# Patient Record
Sex: Female | Born: 1995 | Race: Black or African American | Hispanic: No | Marital: Single | Smoking: Former smoker
Health system: Southern US, Community
[De-identification: ages and names within clinical notes are randomized; demographics above are authoritative.]

## PROBLEM LIST (undated history)

## (undated) DIAGNOSIS — R011 Cardiac murmur, unspecified: Secondary | ICD-10-CM

## (undated) DIAGNOSIS — I10 Essential (primary) hypertension: Secondary | ICD-10-CM

## (undated) DIAGNOSIS — R569 Unspecified convulsions: Secondary | ICD-10-CM

## (undated) DIAGNOSIS — J45909 Unspecified asthma, uncomplicated: Secondary | ICD-10-CM

## (undated) DIAGNOSIS — F410 Panic disorder [episodic paroxysmal anxiety] without agoraphobia: Secondary | ICD-10-CM

---
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
## 2014-11-06 ENCOUNTER — Telehealth: Payer: Self-pay | Admitting: *Deleted

## 2014-11-06 NOTE — Telephone Encounter (Signed)
I HAVE MADE SEVERAL ATTEMPTS TO REACH THE PT TO GET INFORMATION ON HER PCP, SO THAT I CAN GET MEDICAL RECORDS. WHEN I CALL THE PHONE JUST RINGS NO VM.

## 2014-11-12 ENCOUNTER — Ambulatory Visit: Payer: Medicaid Other | Admitting: Cardiovascular Disease

## 2014-11-20 ENCOUNTER — Encounter: Payer: Self-pay | Admitting: Cardiovascular Disease

## 2015-01-31 ENCOUNTER — Encounter (HOSPITAL_COMMUNITY): Payer: Self-pay

## 2015-01-31 ENCOUNTER — Emergency Department (HOSPITAL_COMMUNITY)
Admission: EM | Admit: 2015-01-31 | Discharge: 2015-01-31 | Disposition: A | Discharge status: Home / Self Care | Payer: Medicaid Other | Attending: Emergency Medicine | Admitting: Emergency Medicine

## 2015-01-31 DIAGNOSIS — F419 Anxiety disorder, unspecified: Secondary | ICD-10-CM | POA: Insufficient documentation

## 2015-01-31 DIAGNOSIS — F410 Panic disorder [episodic paroxysmal anxiety] without agoraphobia: Secondary | ICD-10-CM

## 2015-01-31 DIAGNOSIS — Z720 Tobacco use: Secondary | ICD-10-CM | POA: Insufficient documentation

## 2015-01-31 MED ORDER — IBUPROFEN 200 MG PO TABS
600.0000 mg | ORAL_TABLET | Freq: Once | ORAL | Status: AC
  Administered 2015-01-31: 600 mg via ORAL
  Filled 2015-01-31: qty 3

## 2015-01-31 MED ORDER — ACETAMINOPHEN 325 MG PO TABS
975.0000 mg | ORAL_TABLET | Freq: Once | ORAL | Status: AC
  Administered 2015-01-31: 975 mg via ORAL
  Filled 2015-01-31: qty 3

## 2015-01-31 NOTE — Discharge Instructions (Signed)
Return here as needed.  Follow up with a primary care doctor °
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
## 2015-02-03 ENCOUNTER — Emergency Department (HOSPITAL_COMMUNITY)
Admission: EM | Admit: 2015-02-03 | Discharge: 2015-02-03 | Disposition: A | Discharge status: Home / Self Care | Payer: Medicaid Other | Attending: Emergency Medicine | Admitting: Emergency Medicine

## 2015-02-03 DIAGNOSIS — S51851A Open bite of right forearm, initial encounter: Secondary | ICD-10-CM | POA: Diagnosis not present

## 2015-02-03 DIAGNOSIS — Z720 Tobacco use: Secondary | ICD-10-CM | POA: Insufficient documentation

## 2015-02-03 DIAGNOSIS — Y998 Other external cause status: Secondary | ICD-10-CM | POA: Insufficient documentation

## 2015-02-03 DIAGNOSIS — Y9389 Activity, other specified: Secondary | ICD-10-CM | POA: Insufficient documentation

## 2015-02-03 DIAGNOSIS — Y9289 Other specified places as the place of occurrence of the external cause: Secondary | ICD-10-CM | POA: Diagnosis not present

## 2015-02-03 DIAGNOSIS — W503XXA Accidental bite by another person, initial encounter: Secondary | ICD-10-CM

## 2015-02-03 MED ORDER — AMOXICILLIN-POT CLAVULANATE 875-125 MG PO TABS: 1.0000 | ORAL_TABLET | Freq: Two times a day (BID) | ORAL | Status: DC

## 2015-02-03 MED ORDER — NAPROXEN 500 MG PO TABS: 500.0000 mg | ORAL_TABLET | Freq: Two times a day (BID) | ORAL | Status: DC
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
## 2015-02-16 ENCOUNTER — Encounter: Payer: Self-pay | Admitting: Cardiovascular Disease

## 2015-02-16 ENCOUNTER — Ambulatory Visit: Payer: Medicaid Other | Admitting: Cardiovascular Disease

## 2015-02-17 ENCOUNTER — Encounter (HOSPITAL_COMMUNITY): Payer: Self-pay | Admitting: *Deleted

## 2015-02-17 ENCOUNTER — Encounter: Payer: Self-pay | Admitting: Cardiovascular Disease

## 2015-02-17 ENCOUNTER — Emergency Department (HOSPITAL_COMMUNITY)
Admission: EM | Admit: 2015-02-17 | Discharge: 2015-02-17 | Disposition: A | Discharge status: Home / Self Care | Payer: Medicaid Other | Attending: Emergency Medicine | Admitting: Emergency Medicine

## 2015-02-17 DIAGNOSIS — Z720 Tobacco use: Secondary | ICD-10-CM | POA: Diagnosis not present

## 2015-02-17 DIAGNOSIS — Z791 Long term (current) use of non-steroidal anti-inflammatories (NSAID): Secondary | ICD-10-CM | POA: Insufficient documentation

## 2015-02-17 DIAGNOSIS — Z792 Long term (current) use of antibiotics: Secondary | ICD-10-CM | POA: Insufficient documentation

## 2015-02-17 DIAGNOSIS — F419 Anxiety disorder, unspecified: Secondary | ICD-10-CM | POA: Insufficient documentation
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
## 2015-02-17 NOTE — Discharge Instructions (Signed)
Panic Attacks Follow up with [facility] for anxiety.  Panic attacks are sudden, short feelings of great fear or discomfort. You may have them for no reason when you are relaxed, when you are uneasy (anxious), or when you are sleeping.  HOME CARE  Take all your medicines as told.  Check with your doctor before starting new medicines.  Keep all doctor visits. GET HELP IF:  You are not able to take your medicines as told.  Your symptoms do not get better.  Your symptoms get worse. GET HELP RIGHT AWAY IF:  Your attacks seem different than your normal attacks.  You have thoughts about hurting yourself or others.  You take panic attack medicine and you have a side effect. MAKE SURE YOU:  Understand these instructions.  Will watch your condition.  Will get help right away if you are not doing well or get worse.   This information is not intended to replace advice given to you by your health care provider. Make sure you discuss any questions you have with your health care provider.   Document Released: 05/07/2010 Document Revised: 01/23/2013 Document Reviewed: 11/16/2012 Elsevier Interactive Patient Education [employer]2016 Elsevier Inc.

## 2015-02-17 NOTE — Progress Notes (Signed)
Patient listed as having Medicaid insurance without a pcp.  [facility]'s [facility] card [facility]  System updated.
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
## 2015-05-27 ENCOUNTER — Encounter (HOSPITAL_COMMUNITY): Payer: Self-pay | Admitting: *Deleted

## 2015-05-27 ENCOUNTER — Emergency Department (HOSPITAL_COMMUNITY)
Admission: EM | Admit: 2015-05-27 | Discharge: 2015-05-27 | Disposition: A | Discharge status: Home / Self Care | Payer: Medicaid Other | Attending: Emergency Medicine | Admitting: Emergency Medicine

## 2015-05-27 DIAGNOSIS — R040 Epistaxis: Secondary | ICD-10-CM | POA: Insufficient documentation

## 2015-05-27 DIAGNOSIS — G479 Sleep disorder, unspecified: Secondary | ICD-10-CM | POA: Diagnosis not present

## 2015-05-27 DIAGNOSIS — F17200 Nicotine dependence, unspecified, uncomplicated: Secondary | ICD-10-CM | POA: Insufficient documentation

## 2015-05-27 DIAGNOSIS — Z792 Long term (current) use of antibiotics: Secondary | ICD-10-CM | POA: Insufficient documentation

## 2015-05-27 DIAGNOSIS — H6123 Impacted cerumen, bilateral: Secondary | ICD-10-CM | POA: Insufficient documentation

## 2015-05-27 DIAGNOSIS — R1110 Vomiting, unspecified: Secondary | ICD-10-CM | POA: Diagnosis not present

## 2015-05-27 DIAGNOSIS — R05 Cough: Secondary | ICD-10-CM | POA: Diagnosis present

## 2015-05-27 DIAGNOSIS — Z791 Long term (current) use of non-steroidal anti-inflammatories (NSAID): Secondary | ICD-10-CM | POA: Diagnosis not present

## 2015-05-27 DIAGNOSIS — J069 Acute upper respiratory infection, unspecified: Secondary | ICD-10-CM | POA: Diagnosis not present

## 2015-05-27 MED ORDER — PROMETHAZINE-DM 6.25-15 MG/5ML PO SYRP: 5.0000 mL | ORAL_SOLUTION | Freq: Four times a day (QID) | ORAL | Status: DC | PRN

## 2015-05-27 MED ORDER — GUAIFENESIN 100 MG/5ML PO LIQD: 100.0000 mg | ORAL | Status: DC | PRN

## 2015-05-27 NOTE — ED Notes (Signed)
Pt has multiple complaints. Reports cough, cold symptoms, headaches, difficulty sleeping, episodes of sob, nose bleeds, vomiting. No distress noted at triage.

## 2015-05-27 NOTE — Discharge Instructions (Signed)
Your temperature is 98.5.  Your blood pressure is 126/85.  Your heart rate is 100.  Read instruction below.  Take tylenol or ibuprofen as needed for fever and body aches.  Drink plenty of fluid  Upper Respiratory Infection, Adult Most upper respiratory infections (URIs) are a viral infection of the air passages leading to the lungs. A URI affects the nose, throat, and upper air passages. The most common type of URI is nasopharyngitis and is typically referred to as "the common cold." URIs run their course and usually go away on their own. Most of the time, a URI does not require medical attention, but sometimes a bacterial infection in the upper airways can follow a viral infection. This is called a secondary infection. Sinus and middle ear infections are common types of secondary upper respiratory infections. Bacterial pneumonia can also complicate a URI. A URI can worsen asthma and chronic obstructive pulmonary disease (COPD). Sometimes, these complications can require emergency medical care and may be life threatening.  CAUSES Almost all URIs are caused by viruses. A virus is a type of germ and can spread from one person to another.  RISKS FACTORS You may be at risk for a URI if:   You smoke.   You have chronic heart or lung disease.  You have a weakened defense (immune) system.   You are very young or very old.   You have nasal allergies or asthma.  You work in crowded or poorly ventilated areas.  You work in health care facilities or schools. SIGNS AND SYMPTOMS  Symptoms typically develop 2-3 days after you come in contact with a cold virus. Most viral URIs last 7-10 days. However, viral URIs from the influenza virus (flu virus) can last 14-18 days and are typically more severe. Symptoms may include:   Runny or stuffy (congested) nose.   Sneezing.   Cough.   Sore throat.   Headache.   Fatigue.   Fever.   Loss of appetite.   Pain in your forehead, behind your  eyes, and over your cheekbones (sinus pain).  Muscle aches.  DIAGNOSIS  Your health care provider may diagnose a URI by:  Physical exam.  Tests to check that your symptoms are not due to another condition such as:  Strep throat.  Sinusitis.  Pneumonia.  Asthma. TREATMENT  A URI goes away on its own with time. It cannot be cured with medicines, but medicines may be prescribed or recommended to relieve symptoms. Medicines may help:  Reduce your fever.  Reduce your cough.  Relieve nasal congestion. HOME CARE INSTRUCTIONS   Take medicines only as directed by your health care provider.   Gargle warm saltwater or take cough drops to comfort your throat as directed by your health care provider.  Use a warm mist humidifier or inhale steam from a shower to increase air moisture. This may make it easier to breathe.  Drink enough fluid to keep your urine clear or pale yellow.   Eat soups and other clear broths and maintain good nutrition.   Rest as needed.   Return to work when your temperature has returned to normal or as your health care provider advises. You may need to stay home longer to avoid infecting others. You can also use a face mask and careful hand washing to prevent spread of the virus.  Increase the usage of your inhaler if you have asthma.   Do not use any tobacco products, including cigarettes, chewing tobacco, or electronic cigarettes.  If you need help quitting, ask your health care provider. PREVENTION  The best way to protect yourself from getting a cold is to practice good hygiene.   Avoid oral or hand contact with people with cold symptoms.   Wash your hands often if contact occurs.  There is no clear evidence that vitamin C, vitamin E, echinacea, or exercise reduces the chance of developing a cold. However, it is always recommended to get plenty of rest, exercise, and practice good nutrition.  SEEK MEDICAL CARE IF:   You are getting worse  rather than better.   Your symptoms are not controlled by medicine.   You have chills.  You have worsening shortness of breath.  You have brown or red mucus.  You have yellow or brown nasal discharge.  You have pain in your face, especially when you bend forward.  You have a fever.  You have swollen neck glands.  You have pain while swallowing.  You have white areas in the back of your throat. SEEK IMMEDIATE MEDICAL CARE IF:   You have severe or persistent:  Headache.  Ear pain.  Sinus pain.  Chest pain.  You have chronic lung disease and any of the following:  Wheezing.  Prolonged cough.  Coughing up blood.  A change in your usual mucus.  You have a stiff neck.  You have changes in your:  Vision.  Hearing.  Thinking.  Mood. MAKE SURE YOU:   Understand these instructions.  Will watch your condition.  Will get help right away if you are not doing well or get worse.   This information is not intended to replace advice given to you by your health care provider. Make sure you discuss any questions you have with your health care provider.   Document Released: 09/28/2000 Document Revised: 08/19/2014 Document Reviewed: 07/10/2013 Elsevier Interactive Patient Education [employer].

## 2015-05-27 NOTE — ED Provider Notes (Signed)
CSN: [id number]     Arrival date & time 05/27/15  1821 History  By signing my name below, I, Mackenzie Schultz, attest that this documentation has been prepared under the direction and in the presence of Fayrene Helper, PA-C.  Electronically Signed: Murriel Schultz, ED Scribe. 05/27/2015. 9:06 PM.    Chief Complaint  Patient presents with  . Headache  . URI      Patient is a 20 y.o. female presenting with headaches and URI. The history is provided by the patient. No language interpreter was used.  Headache Associated symptoms: cough, URI and vomiting   URI Presenting symptoms: cough   Associated symptoms: headaches     HPI Comments: Mackenzie Schultz is a 20 y.o. female who presents to the Emergency Department complaining of a constant, worsening,productive cough with green sputum that has been present for a week. Pt reports a few days ago she began having an intermittent aching 10/10 headache. Pt states her headache is the worst of all her symptoms. Pt also reports having intermittent episodes of SOB, nose bleeds, chills, and episodes of post-tussive vomiting. Pt denies taking any medications or doing anything to treat her symptoms. Pt denies hemoptysis, no allergies to medications.     History reviewed. No pertinent past medical history. History reviewed. No pertinent past surgical history. History reviewed. No pertinent family history. Social History  Substance Use Topics  . Smoking status: Current Some Day Smoker  . Smokeless tobacco: None  . Alcohol Use: Yes     Comment: Occasional   OB History    No data available     Review of Systems  Constitutional: Positive for chills.  HENT: Positive for nosebleeds.   Respiratory: Positive for cough and shortness of breath.   Gastrointestinal: Positive for vomiting.  Neurological: Positive for headaches.  Psychiatric/Behavioral: Positive for sleep disturbance.      Allergies  Review of patient's allergies indicates no known  allergies.  Home Medications   Prior to Admission medications   Medication Sig Start Date End Date Taking? Authorizing Provider  amoxicillin-clavulanate (AUGMENTIN) 875-125 MG tablet Take 1 tablet by mouth 2 (two) times daily. One po bid x 7 days 02/03/15   Arthor Captain, PA-C  naproxen (NAPROSYN) 500 MG tablet Take 1 tablet (500 mg total) by mouth 2 (two) times daily with a meal. 02/03/15   Arthor Captain, PA-C   BP 126/85 mmHg  Pulse 100  Temp(Src) 98.5 F (36.9 C) (Oral)  Resp 18  SpO2 99%  LMP 05/06/2015 Physical Exam  Constitutional: She is oriented to person, place, and time. She appears well-developed and well-nourished.  HENT:  Head: Normocephalic and atraumatic.  Ears: Cerumen impaction to both ears- unable to visualize TM's Throat: uvula midline, no tonsillar enlargement, no exudates   Neck:  No nuchal rigidity  Cardiovascular: Normal rate, regular rhythm and normal heart sounds.   Pulmonary/Chest: Effort normal and breath sounds normal.  Abdominal: She exhibits no distension.  Neurological: She is alert and oriented to person, place, and time.  Skin: Skin is warm and dry.  Psychiatric: She has a normal mood and affect.  Nursing note and vitals reviewed.   ED Course  Procedures (including critical care time)  DIAGNOSTIC STUDIES: Oxygen Saturation is 99% on room air, normal by my interpretation.    COORDINATION OF CARE: 9:03 PM Discussed treatment plan with pt at bedside and pt agreed to plan.    MDM   Final diagnoses:  URI (upper respiratory infection)  BP 126/85 mmHg  Pulse 100  Temp(Src) 98.5 F (36.9 C) (Oral)  Resp 18  SpO2 99%  LMP 05/06/2015   I personally performed the services described in this documentation, which was scribed in my presence. The recorded information has been reviewed and is accurate.     9:07 PM Patient symptoms consistence with an upper respiratory infection. She is otherwise well-appearing. No nuchal rigidity to  suggest meningitis. Her lungs are clear, low suspicion for pneumonia. No fever or hypoxia. Recommend symptomatic treatment.   Fayrene Helper, PA-C 05/27/15 2108  Glynn Octave, MD 05/28/15 [phone]

## 2015-11-16 ENCOUNTER — Emergency Department (HOSPITAL_COMMUNITY)
Admission: EM | Admit: 2015-11-16 | Discharge: 2015-11-16 | Disposition: A | Discharge status: Home / Self Care | Payer: Medicaid Other | Attending: Emergency Medicine | Admitting: Emergency Medicine

## 2015-11-16 ENCOUNTER — Encounter (HOSPITAL_COMMUNITY): Payer: Self-pay | Admitting: Emergency Medicine

## 2015-11-16 DIAGNOSIS — F17210 Nicotine dependence, cigarettes, uncomplicated: Secondary | ICD-10-CM | POA: Insufficient documentation

## 2015-11-16 DIAGNOSIS — F410 Panic disorder [episodic paroxysmal anxiety] without agoraphobia: Secondary | ICD-10-CM | POA: Diagnosis not present

## 2015-11-16 DIAGNOSIS — F411 Generalized anxiety disorder: Secondary | ICD-10-CM

## 2015-11-16 HISTORY — DX: Cardiac murmur, unspecified: R01.1

## 2015-11-16 HISTORY — DX: Panic disorder (episodic paroxysmal anxiety): F41.0

## 2015-11-16 MED ORDER — IBUPROFEN 200 MG PO TABS
600.0000 mg | ORAL_TABLET | Freq: Once | ORAL | Status: AC
  Administered 2015-11-16: 600 mg via ORAL
  Filled 2015-11-16: qty 3

## 2015-11-16 NOTE — ED Provider Notes (Signed)
[facility]-EMERGENCY DEPT Provider Note   CSN: [id number] Arrival date & time: 11/16/15  6967  First Provider Contact:  First MD Initiated Contact with Patient 11/16/15 [id number]        History   Chief Complaint Chief Complaint  Patient presents with  . Panic Attack    HPI Ethyle ARDITA SURGEON is a 20 y.o. female.  Patient with a history of panic attacks.  She reports that she had a panic attack approximately 1 AM today after having a disagreement.  She reports multiple recent panic attacks and currently is without any medication for these.  She does report generalized headache at this time.  She denies weakness of her arms or legs.  She does report that she had paresthesias of her bilateral hands during the "panic attack".  At time of my evaluation the patient is currently without complaint except for mild headache.  She reports all paresthesias have resolved.      Past Medical History:  Diagnosis Date  . Heart murmur   . Panic attacks     There are no active problems to display for this patient.   History reviewed. No pertinent surgical history.  OB History    No data available       Home Medications    Prior to Admission medications   Medication Sig Start Date End Date Taking? Authorizing Provider  amoxicillin-clavulanate (AUGMENTIN) 875-125 MG tablet Take 1 tablet by mouth 2 (two) times daily. One po bid x 7 days 02/03/15   Arthor Captain, PA-C  guaiFENesin (ROBITUSSIN) 100 MG/5ML liquid Take 5-10 mLs (100-200 mg total) by mouth every 4 (four) hours as needed for congestion. 05/27/15   Fayrene Helper, PA-C  naproxen (NAPROSYN) 500 MG tablet Take 1 tablet (500 mg total) by mouth 2 (two) times daily with a meal. 02/03/15   Arthor Captain, PA-C  promethazine-dextromethorphan (PROMETHAZINE-DM) 6.25-15 MG/5ML syrup Take 5 mLs by mouth 4 (four) times daily as needed for cough. 05/27/15   Fayrene Helper, PA-C    Family History Family History  Problem Relation Age of Onset  . Hypertension  Other     Social History Social History  Substance Use Topics  . Smoking status: Current Some Day Smoker    Types: Cigars  . Smokeless tobacco: Never Used  . Alcohol use Yes     Comment: Occasional     Allergies   Review of patient's allergies indicates no known allergies.   Review of Systems Review of Systems  All other systems reviewed and are negative.    Physical Exam Updated Vital Signs BP 118/82 (BP Location: Right Arm)   Pulse 96   Temp 98.4 F (36.9 C) (Oral)   Resp 13   Ht 5\' 7"  (1.702 m)   Wt 145 lb (65.8 kg)   LMP 10/31/2015 (Approximate)   SpO2 100%   BMI 22.71 kg/m   Physical Exam  Constitutional: She is oriented to person, place, and time. She appears well-developed and well-nourished.  HENT:  Head: Normocephalic.  Eyes: EOM are normal.  Neck: Normal range of motion.  Pulmonary/Chest: Effort normal.  Abdominal: She exhibits no distension.  Musculoskeletal: Normal range of motion.  Neurological: She is alert and oriented to person, place, and time.  Psychiatric: She has a normal mood and affect.  Nursing note and vitals reviewed.    ED Treatments / Results  Labs (all labs ordered are listed, but only abnormal results are displayed) Labs Reviewed - No data to display  EKG  EKG Interpretation None       Radiology No results found.  Procedures Procedures (including critical care time)  Medications Ordered in ED Medications  ibuprofen (ADVIL,MOTRIN) tablet 600 mg (600 mg Oral Given 11/16/15 0711)     Initial Impression / Assessment and Plan / ED Course  I have reviewed the triage vital signs and the nursing notes.  Pertinent labs & imaging results that were available during my care of the patient were reviewed by me and considered in my medical decision making (see chart for details).  Clinical Course    Well-appearing at this time.  Screening examination completed.  No life-threatening emergency.  Patient referred to  [facility] in [place] for ongoing mental health needs as well as primary care follow-up  Final Clinical Impressions(s) / ED Diagnoses   Final diagnoses:  Panic attack  Generalized anxiety disorder    New Prescriptions New Prescriptions   No medications on file     Azalia Bilis, MD 11/16/15 [phone]

## 2015-11-16 NOTE — ED Notes (Signed)
No respiratory or acute distress noted resting in bed call light in reach. 

## 2015-11-16 NOTE — ED Triage Notes (Signed)
Per EMS pt states she had an argument with her girlfriend tonight that triggered her to have a panic attack  Pt states she has hx of same  Pt is c/o headache and shaking all over

## 2015-11-16 NOTE — ED Notes (Signed)
Pt reports having panic attack at appx 1am today after having disagreement with significant other. Pt reports prior history of anxiety attack but not currently taking any medications.

## 2016-01-25 ENCOUNTER — Encounter (HOSPITAL_COMMUNITY): Payer: Self-pay

## 2016-01-25 DIAGNOSIS — R51 Headache: Secondary | ICD-10-CM | POA: Insufficient documentation

## 2016-01-25 DIAGNOSIS — F17290 Nicotine dependence, other tobacco product, uncomplicated: Secondary | ICD-10-CM | POA: Diagnosis not present

## 2016-01-25 NOTE — ED Triage Notes (Signed)
Pt complaining of headache x 2 days. Pt states pain is behind eyes. Pt states hx of heart defect. Pt denies any n/v/d.

## 2016-01-26 ENCOUNTER — Emergency Department (HOSPITAL_COMMUNITY)
Admission: EM | Admit: 2016-01-26 | Discharge: 2016-01-26 | Disposition: A | Discharge status: Home / Self Care | Payer: Medicaid Other | Attending: Emergency Medicine | Admitting: Emergency Medicine

## 2016-01-26 DIAGNOSIS — R51 Headache: Secondary | ICD-10-CM

## 2016-01-26 DIAGNOSIS — R519 Headache, unspecified: Secondary | ICD-10-CM

## 2016-01-26 MED ORDER — SODIUM CHLORIDE 0.9 % IV BOLUS (SEPSIS)
1000.0000 mL | Freq: Once | INTRAVENOUS | Status: AC
  Administered 2016-01-26: 1000 mL via INTRAVENOUS

## 2016-01-26 MED ORDER — METOCLOPRAMIDE HCL 5 MG/ML IJ SOLN
10.0000 mg | Freq: Once | INTRAMUSCULAR | Status: AC
  Administered 2016-01-26: 10 mg via INTRAVENOUS
  Filled 2016-01-26: qty 2

## 2016-01-26 MED ORDER — TRAMADOL HCL 50 MG PO TABS
50.0000 mg | ORAL_TABLET | Freq: Once | ORAL | Status: AC
  Administered 2016-01-26: 50 mg via ORAL
  Filled 2016-01-26: qty 1

## 2016-01-26 MED ORDER — KETOROLAC TROMETHAMINE 30 MG/ML IJ SOLN
30.0000 mg | Freq: Once | INTRAMUSCULAR | Status: AC
  Administered 2016-01-26: 30 mg via INTRAVENOUS
  Filled 2016-01-26: qty 1

## 2016-01-26 NOTE — ED Provider Notes (Signed)
[facility]-EMERGENCY DEPT Provider Note   CSN: [id number] Arrival date & time: 01/25/16  2222     History   Chief Complaint Chief Complaint  Patient presents with  . Headache    HPI Mackenzie Schultz is a 20 y.o. female.  HPI  Patient has PMH of heart murmur, panic attacks and comes to the ER for headache for 2 days, she has tried Motrin and Aleve which helps some but then it comes back.The patients pain is felt to be behind her eyes, describes the pain as pressure or sharp pains, hx of migraines and this feels the same just more severe than normal. She does not have any N/V/D and has not experienced any neck pain or fevers. Rates pain at 8/10  Past Medical History:  Diagnosis Date  . Heart murmur   . Panic attacks     There are no active problems to display for this patient.   History reviewed. No pertinent surgical history.  OB History    No data available       Home Medications    Prior to Admission medications   Medication Sig Start Date End Date Taking? Authorizing Provider  amoxicillin-clavulanate (AUGMENTIN) 875-125 MG tablet Take 1 tablet by mouth 2 (two) times daily. One po bid x 7 days Patient not taking: Reported on 01/26/2016 02/03/15   Arthor Captain, PA-C  guaiFENesin (ROBITUSSIN) 100 MG/5ML liquid Take 5-10 mLs (100-200 mg total) by mouth every 4 (four) hours as needed for congestion. Patient not taking: Reported on 01/26/2016 05/27/15   Fayrene Helper, PA-C  naproxen (NAPROSYN) 500 MG tablet Take 1 tablet (500 mg total) by mouth 2 (two) times daily with a meal. Patient not taking: Reported on 01/26/2016 02/03/15   Arthor Captain, PA-C  promethazine-dextromethorphan (PROMETHAZINE-DM) 6.25-15 MG/5ML syrup Take 5 mLs by mouth 4 (four) times daily as needed for cough. Patient not taking: Reported on 01/26/2016 05/27/15   Fayrene Helper, PA-C    Family History Family History  Problem Relation Age of Onset  . Hypertension Other     Social History Social History    Substance Use Topics  . Smoking status: Current Some Day Smoker    Types: Cigars  . Smokeless tobacco: Never Used  . Alcohol use Yes     Comment: Occasional     Allergies   Review of patient's allergies indicates no known allergies.   Review of Systems Review of Systems  Review of Systems All other systems negative except as documented in the HPI. All pertinent positives and negatives as reviewed in the HPI.  Physical Exam Updated Vital Signs BP 132/93   Pulse 67   Temp 98.4 F (36.9 C) (Oral)   Resp 18   Ht 5\' 7"  (1.702 m)   Wt 65.8 kg   LMP 12/27/2015   SpO2 100%   BMI 22.71 kg/m   Physical Exam  Constitutional: She appears well-developed and well-nourished. No distress.  HENT:  Head: Normocephalic and atraumatic.  Right Ear: Tympanic membrane and ear canal normal.  Left Ear: Tympanic membrane and ear canal normal.  Nose: Nose normal.  Mouth/Throat: Uvula is midline, oropharynx is clear and moist and mucous membranes are normal.  Eyes: Pupils are equal, round, and reactive to light.  Neck: Normal range of motion. Neck supple.  Cardiovascular: Normal rate and regular rhythm.   Pulmonary/Chest: Effort normal.  Abdominal: Soft.  No signs of abdominal distention  Musculoskeletal:  No LE swelling  Neurological: She is alert.  Acting at  baseline Cranial nerves grossly intact on exam. Pt alert and oriented x 3 Upper and lower extremity strength is symmetrical and physiologic Normal muscular tone No facial droop Coordination intact, no limb ataxia,No pronator drift  Skin: Skin is warm and dry. No rash noted.  Nursing note and vitals reviewed.    ED Treatments / Results  Labs (all labs ordered are listed, but only abnormal results are displayed) Labs Reviewed - No data to display  EKG  EKG Interpretation None       Radiology No results found.  Procedures Procedures (including critical care time)  Medications Ordered in ED Medications  sodium  chloride 0.9 % bolus 1,000 mL (1,000 mLs Intravenous New Bag/Given 01/26/16 0450)  metoCLOPramide (REGLAN) injection 10 mg (10 mg Intravenous Given 01/26/16 0450)  ketorolac (TORADOL) 30 MG/ML injection 30 mg (30 mg Intravenous Given 01/26/16 0450)     Initial Impression / Assessment and Plan / ED Course  I have reviewed the triage vital signs and the nursing notes.  Pertinent labs & imaging results that were available during my care of the patient were reviewed by me and considered in my medical decision making (see chart for details).  Clinical Course    Pt HA treated and improved while in ED.  Presentation is like pts typical HA and non concerning [facility], ICH, Meningitis, or temporal arteritis. Pt is afebrile with no focal neuro deficits, nuchal rigidity, or change in vision. Pt is to follow up with PCP to discuss prophylactic medication. Pt verbalizes understanding and is agreeable with plan to dc.    Final Clinical Impressions(s) / ED Diagnoses   Final diagnoses:  Nonintractable headache, unspecified chronicity pattern, unspecified headache type    New Prescriptions New Prescriptions   No medications on file     Marlon Peliffany Aimee Timmons, PA-C 01/26/16 0529    Derwood KaplanAnkit Nanavati, MD 01/28/16 [phone]

## 2016-01-26 NOTE — ED Notes (Signed)
Pt was found sleeping in the lobby, notified her that she was skipped for a room.

## 2016-01-26 NOTE — ED Notes (Addendum)
Pt was called to be taken to room, no response.

## 2017-01-24 ENCOUNTER — Emergency Department (HOSPITAL_COMMUNITY)
Admission: EM | Admit: 2017-01-24 | Discharge: 2017-01-24 | Disposition: A | Discharge status: Home / Self Care | Payer: Medicaid Other | Attending: Emergency Medicine | Admitting: Emergency Medicine

## 2017-01-24 ENCOUNTER — Encounter (HOSPITAL_COMMUNITY): Payer: Self-pay | Admitting: Emergency Medicine

## 2017-01-24 ENCOUNTER — Emergency Department (HOSPITAL_COMMUNITY): Payer: Medicaid Other

## 2017-01-24 DIAGNOSIS — F17290 Nicotine dependence, other tobacco product, uncomplicated: Secondary | ICD-10-CM | POA: Diagnosis not present

## 2017-01-24 DIAGNOSIS — R079 Chest pain, unspecified: Secondary | ICD-10-CM | POA: Insufficient documentation

## 2017-01-24 DIAGNOSIS — R0602 Shortness of breath: Secondary | ICD-10-CM | POA: Diagnosis not present

## 2017-01-24 DIAGNOSIS — Z79899 Other long term (current) drug therapy: Secondary | ICD-10-CM | POA: Diagnosis not present

## 2017-01-24 DIAGNOSIS — R519 Headache, unspecified: Secondary | ICD-10-CM

## 2017-01-24 DIAGNOSIS — R51 Headache: Secondary | ICD-10-CM | POA: Diagnosis present

## 2017-01-24 DIAGNOSIS — R0789 Other chest pain: Secondary | ICD-10-CM

## 2017-01-24 LAB — CBC
HCT: 39.9 % (ref 36.0–46.0)
HEMOGLOBIN: 13.2 g/dL (ref 12.0–15.0)
MCH: 29.1 pg (ref 26.0–34.0)
MCHC: 33.1 g/dL (ref 30.0–36.0)
MCV: 88.1 fL (ref 78.0–100.0)
Platelets: 249 10*3/uL (ref 150–400)
RBC: 4.53 MIL/uL (ref 3.87–5.11)
RDW: 12.7 % (ref 11.5–15.5)
WBC: 10.3 10*3/uL (ref 4.0–10.5)

## 2017-01-24 LAB — I-STAT TROPONIN, ED
TROPONIN I, POC: 0 ng/mL (ref 0.00–0.08)
Troponin i, poc: 0 ng/mL (ref 0.00–0.08)

## 2017-01-24 LAB — BASIC METABOLIC PANEL
ANION GAP: 9 (ref 5–15)
BUN: 14 mg/dL (ref 6–20)
CHLORIDE: 104 mmol/L (ref 101–111)
CO2: 22 mmol/L (ref 22–32)
Calcium: 9 mg/dL (ref 8.9–10.3)
Creatinine, Ser: 0.97 mg/dL (ref 0.44–1.00)
GFR calc non Af Amer: >60 mL/min (ref >60)
Glucose, Bld: 96 mg/dL (ref 65–99)
POTASSIUM: 3.7 mmol/L (ref 3.5–5.1)
Sodium: 135 mmol/L (ref 135–145)

## 2017-01-24 MED ORDER — BENZONATATE 100 MG PO CAPS: 100.0000 mg | ORAL_CAPSULE | Freq: Three times a day (TID) | ORAL | 0 refills | Status: DC

## 2017-01-24 MED ORDER — GI COCKTAIL ~~LOC~~
30.0000 mL | Freq: Once | ORAL | Status: AC
  Administered 2017-01-24: 30 mL via ORAL
  Filled 2017-01-24: qty 30

## 2017-01-24 MED ORDER — KETOROLAC TROMETHAMINE 30 MG/ML IJ SOLN
15.0000 mg | Freq: Once | INTRAMUSCULAR | Status: AC
  Administered 2017-01-24: 15 mg via INTRAVENOUS
  Filled 2017-01-24: qty 1

## 2017-01-24 MED ORDER — DIPHENHYDRAMINE HCL 50 MG/ML IJ SOLN
12.5000 mg | Freq: Once | INTRAMUSCULAR | Status: AC
  Administered 2017-01-24: 12.5 mg via INTRAVENOUS
  Filled 2017-01-24: qty 1

## 2017-01-24 MED ORDER — FLUTICASONE PROPIONATE 50 MCG/ACT NA SUSP: 2.0000 | Freq: Every day | NASAL | 12 refills | Status: DC

## 2017-01-24 MED ORDER — NAPROXEN 375 MG PO TABS: 375.0000 mg | ORAL_TABLET | Freq: Two times a day (BID) | ORAL | 0 refills | Status: DC

## 2017-01-24 MED ORDER — PROCHLORPERAZINE EDISYLATE 5 MG/ML IJ SOLN
10.0000 mg | Freq: Once | INTRAMUSCULAR | Status: AC
  Administered 2017-01-24: 10 mg via INTRAVENOUS
  Filled 2017-01-24: qty 2

## 2017-01-24 MED ORDER — OMEPRAZOLE 20 MG PO CPDR: 20.0000 mg | DELAYED_RELEASE_CAPSULE | Freq: Every day | ORAL | 0 refills | Status: DC

## 2017-01-24 MED ORDER — SODIUM CHLORIDE 0.9 % IV BOLUS (SEPSIS)
1000.0000 mL | Freq: Once | INTRAVENOUS | Status: AC
  Administered 2017-01-24: 1000 mL via INTRAVENOUS

## 2017-01-24 NOTE — ED Triage Notes (Signed)
Pt states she has been having a migraine for a few days. Pt states today she felt SOB and started having CP. Pt states she is nauseous but has not vomited.

## 2017-01-24 NOTE — Discharge Instructions (Signed)
All of your blood work and imaging has been reassuring. Unknown cause of your chest pain. It may be related to your sinus infection and cough. Have given the Flonase to use for nasal congestion. Tessalon for cough. Please take the Naproxen as prescribed for pain. Do not take any additional NSAIDs including Motrin, Aleve, Ibuprofen, Advil. Would also like for you to take Prilosec once a day for acid reflux.  Please note she had close follow-up with a PCP. Return to the ED if you develop any worsening symptoms do not improve in the next 4-5 days.

## 2017-01-24 NOTE — ED Notes (Signed)
Pt requesting copy of CXR

## 2017-01-24 NOTE — ED Provider Notes (Signed)
[facility]-EMERGENCY DEPT Provider Note   CSN: [id number] Arrival date & time: 01/24/17  1253     History   Chief Complaint Chief Complaint  Patient presents with  . Chest Pain  . Headache    HPI Mackenzie Schultz is a 21 y.o. female.  HPI 21 year old African-American female past medical history significant for panic attacks and heart murmur presents to the ED today with multiple complaints. The patient states for the past 2-3 days she has had a migraine. Patient's history is a history of migraines. Describes the headache as a pressure. States this feels typical of her migraines. Reports associated nausea, light sensitivity, sound sensitivity. Denies any associated vision changes or fevers or neck stiffness. Mackenzie Schultz was gradual in onset Denies any red flag symptoms including night sweats, neck stiffness, vision changes, acute onset.   Patient also states for the past 2-3 days she has had on and off chest pain or shortness of breath. States that this morning the symptoms worsen which is why she came to the ED for evaluation. Patient describes the chest pain as sharp and pressure in nature. It does not radiate. It is substernal. Not associated with exertion or pleuritic in nature. Denies any associated diaphoresis, emesis. Does report some nausea. 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 She also reports a heart murmur at baseline. The patient states that palpation of chest wall makes the pain worse. Denies any history of diabetes, high blood pressure, MI. Denies any significant cardiac family history. Patient does report having the nexplanon. Denies any unilateral leg swelling or calf tenderness, recent hospitalizations or surgeries, prolonged immobilization, history of DVT/PE. She is not taking for his symptoms prior to arrival. Patient also reports that her house has a history of mold. She does report sinus congestion, rhinorrhea, sore throat,  nonproductive cough for the past few days. States that this may be related to her headache and chest pain. Denies any tobacco use.   Pt denies any fever, chill, ha, vision changes, lightheadedness, dizziness, congestion, neck pain,  abd pain, v/d, urinary symptoms, change in bowel habits, melena, hematochezia, lower extremity paresthesias.  Past Medical History:  Diagnosis Date  . Heart murmur   . Panic attacks     There are no active problems to display for this patient.   History reviewed. No pertinent surgical history.  OB History    No data available       Home Medications    Prior to Admission medications   Medication Sig Start Date End Date Taking? Authorizing Provider  amoxicillin-clavulanate (AUGMENTIN) 875-125 MG tablet Take 1 tablet by mouth 2 (two) times daily. One po bid x 7 days Patient not taking: Reported on 01/26/2016 02/03/15   Arthor Captain, PA-C  guaiFENesin (ROBITUSSIN) 100 MG/5ML liquid Take 5-10 mLs (100-200 mg total) by mouth every 4 (four) hours as needed for congestion. Patient not taking: Reported on 01/26/2016 05/27/15   Fayrene Helper, PA-C  naproxen (NAPROSYN) 500 MG tablet Take 1 tablet (500 mg total) by mouth 2 (two) times daily with a meal. Patient not taking: Reported on 01/26/2016 02/03/15   Arthor Captain, PA-C  promethazine-dextromethorphan (PROMETHAZINE-DM) 6.25-15 MG/5ML syrup Take 5 mLs by mouth 4 (four) times daily as needed for cough. Patient not taking: Reported on 01/26/2016 05/27/15   Fayrene Helper, PA-C    Family History Family History  Problem Relation Age of Onset  . Hypertension Other     Social  History Social History  Substance Use Topics  . Smoking status: Current Some Day Smoker    Types: Cigars  . Smokeless tobacco: Never Used  . Alcohol use Yes     Comment: Occasional     Allergies   Patient has no known allergies.   Review of Systems Review of Systems  Constitutional: Negative for chills, diaphoresis and fever.    HENT: Positive for congestion, rhinorrhea, sinus pain, sinus pressure and sore throat.   Eyes: Positive for photophobia.  Respiratory: Positive for cough and shortness of breath. Negative for wheezing.   Cardiovascular: Positive for chest pain. Negative for palpitations and leg swelling.  Gastrointestinal: Negative for abdominal pain, diarrhea, nausea and vomiting.  Genitourinary: Negative for dysuria, flank pain, frequency, hematuria and urgency.  Musculoskeletal: Negative for arthralgias and myalgias.  Skin: Negative for rash.  Neurological: Negative for dizziness, syncope, weakness, light-headedness, numbness and headaches.  Psychiatric/Behavioral: Negative for sleep disturbance. The patient is not nervous/anxious.      Physical Exam Updated Vital Signs BP (!) 134/100 (BP Location: Left Arm)   Pulse 87   Temp 97.9 F (36.6 C) (Oral)   Resp 16   Ht  (1.702 m)   Wt 65.8 kg (145 lb)   LMP 01/23/2017   SpO2 100%   BMI 22.71 kg/m   Physical Exam  Constitutional: She is oriented to person, place, and time. She appears well-developed and well-nourished.  Non-toxic appearance. No distress.  HENT:  Head: Normocephalic and atraumatic.  Nose: Mucosal edema and rhinorrhea present.  Mouth/Throat: Uvula is midline, oropharynx is clear and moist and mucous membranes are normal.  Eyes: Pupils are equal, round, and reactive to light. Conjunctivae and EOM are normal. Right eye exhibits no discharge. Left eye exhibits no discharge.  Neck: Normal range of motion. Neck supple. No JVD present. No tracheal deviation present.  No c spine midline tenderness. No paraspinal tenderness. No deformities or step offs noted. Full ROM. Supple. No nuchal rigidity.    Cardiovascular: Normal rate, regular rhythm and intact distal pulses.  Exam reveals no gallop and no friction rub.   Murmur (baseline per pt.) heard. Pulmonary/Chest: Effort normal and breath sounds normal. No respiratory distress. She  has no wheezes. She has no rales. She exhibits tenderness.    No hypoxia or tachypnea. Tenderness to palpation over the precordium.  Abdominal: Soft. Bowel sounds are normal. She exhibits no distension. There is no tenderness. There is no rebound and no guarding.  Musculoskeletal: Normal range of motion.  No lower extremity edema or calf tenderness.  Lymphadenopathy:    She has no cervical adenopathy.  Neurological: She is alert and oriented to person, place, and time.  The patient is alert, attentive, and oriented x 3. Speech is clear. Cranial nerve II-VII grossly intact. Negative pronator drift. Sensation intact. Strength 5/5 in all extremities. Reflexes 2+ and symmetric at biceps, triceps, knees, and ankles. Rapid alternating movement and fine finger movements intact. Romberg is absent. Posture and gait normal.   Skin: Skin is warm and dry. Capillary refill takes less than 2 seconds. She is not diaphoretic.  Psychiatric: Her behavior is normal. Judgment and thought content normal.  Nursing note and vitals reviewed.    ED Treatments / Results  Labs (all labs ordered are listed, but only abnormal results are displayed) Labs Reviewed  BASIC METABOLIC PANEL  CBC  I-STAT TROPONIN, ED  I-STAT TROPONIN, ED    EKG  EKG Interpretation  Date/Time:  Tuesday January 24 2017  12:54:28 EDT Ventricular Rate:  82 PR Interval:  156 QRS Duration: 104 QT Interval:  374 QTC Calculation: 436 R Axis:   10 Text Interpretation:  Sinus rhythm with occasional Premature ventricular complexes Incomplete right bundle branch block Borderline ECG Confirmed by Benjiman Core [phone]) on 01/24/2017 4:20:59 PM       Radiology Dg Chest 2 View  Result Date: 01/24/2017 CLINICAL DATA:  Shortness of breath and chest pain. EXAM: CHEST  2 VIEW COMPARISON:  05/28/03 FINDINGS: The heart size and mediastinal contours are within normal limits. Both lungs are clear. The visualized skeletal structures are  unremarkable. IMPRESSION: No active cardiopulmonary disease. Electronically Signed   By: Signa Kell M.D.   On: 01/24/2017 13:42    Procedures Procedures (including critical care time)  Medications Ordered in ED Medications  prochlorperazine (COMPAZINE) injection 10 mg (10 mg Intravenous Given 01/24/17 1527)  sodium chloride 0.9 % bolus 1,000 mL (0 mLs Intravenous Stopped 01/24/17 1702)  ketorolac (TORADOL) 30 MG/ML injection 15 mg (15 mg Intravenous Given 01/24/17 1526)  diphenhydrAMINE (BENADRYL) injection 12.5 mg (12.5 mg Intravenous Given 01/24/17 1529)  gi cocktail (Maalox,Lidocaine,Donnatal) (30 mLs Oral Given 01/24/17 1525)     Initial Impression / Assessment and Plan / ED Course  I have reviewed the triage vital signs and the nursing notes.  Pertinent labs & imaging results that were available during my care of the patient were reviewed by me and considered in my medical decision making (see chart for details).     Patient presents to the ED with multiple complaints including migraine and chest pain with associated shortness of breath. Patient's overall well appearing and nontoxic. Vital signs are reassuring. No hypoxia, no tachypnea patient is afebrile.   On exam lungs are clear to auscultation bilaterally. Patient's no focal neuro deficit. Neurovascularly intact in all extremities. She does have some chest wall tenderness to palpation. Abdominal exam is benign. No nuchal rigidity.  Pt HA treated and improved while in ED.  Presentation is like pts typical HA and non concerning for [facility], ICH, Meningitis, or temporal arteritis. Pt is afebrile with no focal neuro deficits, nuchal rigidity, or change in vision.   Chest pain is not likely of cardiac or pulmonary etiology d/t presentation, perc positive due to nexplanon however low risk wells, VSS, no tracheal deviation, no JVD or new murmur, RRR, breath sounds equal bilaterally, EKG without any change from prior tracing and shows no  signs of ischemia, negative delta troponin, and negative CXR. The patient's presentation does not seem consistent with PE, ACS, dissection, pneumonia and have low supicion. The patient felt much improved after GI cocktail and migraine cocktail. She was sleeping on my reexamination. She states all of her pain is resolved and she feels much improved and ready for discharge. Pt symptoms may be related to there uri with cough.  Pt has been advised to return to the ED is CP becomes exertional, associated with diaphoresis or nausea, radiates to left jaw/arm, worsens or becomes concerning in any way.   Pt is hemodynamically stable, in NAD, & able to ambulate in the ED. Evaluation does not show pathology that would require ongoing emergent intervention or inpatient treatment. I explained the diagnosis to the patient. Pain has been managed & has no complaints prior to dc. Pt is comfortable with above plan and is stable for discharge at this time. All questions were answered prior to disposition. Strict return precautions for f/u to the ED were discussed. Encouraged follow up with  PCP.  Final Clinical Impressions(s) / ED Diagnoses   Final diagnoses:  Atypical chest pain  Acute nonintractable headache, unspecified headache type    New Prescriptions New Prescriptions   BENZONATATE (TESSALON) 100 MG CAPSULE    Take 1 capsule (100 mg total) by mouth every 8 (eight) hours.   FLUTICASONE (FLONASE) 50 MCG/ACT NASAL SPRAY    Place 2 sprays into both nostrils daily.   NAPROXEN (NAPROSYN) 375 MG TABLET    Take 1 tablet (375 mg total) by mouth 2 (two) times daily.   OMEPRAZOLE (PRILOSEC) 20 MG CAPSULE    Take 1 capsule (20 mg total) by mouth daily.     Rise Mu, PA-C 01/24/17 1743    Tilden Fossa, MD 01/25/17 [phone]

## 2017-03-30 ENCOUNTER — Encounter (HOSPITAL_COMMUNITY): Payer: Self-pay | Admitting: Emergency Medicine

## 2017-03-30 ENCOUNTER — Other Ambulatory Visit: Payer: Self-pay

## 2017-03-30 DIAGNOSIS — S93402A Sprain of unspecified ligament of left ankle, initial encounter: Secondary | ICD-10-CM | POA: Diagnosis not present

## 2017-03-30 DIAGNOSIS — Y929 Unspecified place or not applicable: Secondary | ICD-10-CM | POA: Insufficient documentation

## 2017-03-30 DIAGNOSIS — Z79899 Other long term (current) drug therapy: Secondary | ICD-10-CM | POA: Diagnosis not present

## 2017-03-30 DIAGNOSIS — F17290 Nicotine dependence, other tobacco product, uncomplicated: Secondary | ICD-10-CM | POA: Insufficient documentation

## 2017-03-30 DIAGNOSIS — Y9389 Activity, other specified: Secondary | ICD-10-CM | POA: Diagnosis not present

## 2017-03-30 DIAGNOSIS — Y999 Unspecified external cause status: Secondary | ICD-10-CM | POA: Insufficient documentation

## 2017-03-30 DIAGNOSIS — S99912A Unspecified injury of left ankle, initial encounter: Secondary | ICD-10-CM | POA: Diagnosis present

## 2017-03-30 DIAGNOSIS — X500XXA Overexertion from strenuous movement or load, initial encounter: Secondary | ICD-10-CM | POA: Insufficient documentation

## 2017-03-30 NOTE — ED Triage Notes (Signed)
Pt c/o 10/10 left ankle pain. Pt states she twisted it while cleaning, no swollen or deformity noticed.

## 2017-03-31 ENCOUNTER — Emergency Department (HOSPITAL_COMMUNITY): Payer: Medicaid Other

## 2017-03-31 ENCOUNTER — Emergency Department (HOSPITAL_COMMUNITY)
Admission: EM | Admit: 2017-03-31 | Discharge: 2017-03-31 | Disposition: A | Discharge status: Home / Self Care | Payer: Medicaid Other | Attending: Emergency Medicine | Admitting: Emergency Medicine

## 2017-03-31 DIAGNOSIS — S93402A Sprain of unspecified ligament of left ankle, initial encounter: Secondary | ICD-10-CM

## 2017-03-31 MED ORDER — IBUPROFEN 800 MG PO TABS
800.0000 mg | ORAL_TABLET | Freq: Once | ORAL | Status: AC
Start: 2017-03-31 — End: 2017-03-31
  Administered 2017-03-31: 800 mg via ORAL
  Filled 2017-03-31: qty 1

## 2017-03-31 MED ORDER — NAPROXEN 375 MG PO TABS: 375.0000 mg | ORAL_TABLET | Freq: Two times a day (BID) | ORAL | 0 refills | Status: DC

## 2017-03-31 NOTE — ED Notes (Signed)
Ortho to place ASO brac and given crutches

## 2017-03-31 NOTE — ED Provider Notes (Signed)
[facility] Provider Note   CSN: [id number] Arrival date & time: 03/30/17  2312     History   Chief Complaint Chief Complaint  Patient presents with  . Ankle Pain    left    HPI Mackenzie Schultz is a 21 y.o. female.  HPI   21 year old female presenting for evaluation of left ankle pain.  Patient reports 3 days ago as she was cleaning, she twisted her left ankle.  She developed acute onset of sharp pain that is nonradiating, affecting the outside portion of her ankle.  Pain has been persistent, worse with ambulation.  Rates pain is moderate in severity. she is able to bear weight and states that she was trying to use her home crutches but it does not provide adequate support to her armpit.  She report prior injury to the same ankle.  She denies any associated numbness, no knee or hip pain.  No other injury.  Past Medical History:  Diagnosis Date  . Heart murmur   . Panic attacks     There are no active problems to display for this patient.   History reviewed. No pertinent surgical history.  OB History    No data available       Home Medications    Prior to Admission medications   Medication Sig Start Date End Date Taking? Authorizing Provider  amoxicillin-clavulanate (AUGMENTIN) 875-125 MG tablet Take 1 tablet by mouth 2 (two) times daily. One po bid x 7 days Patient not taking: Reported on 01/26/2016 02/03/15   Arthor CaptainHarris, Abigail, PA-C  benzonatate (TESSALON) 100 MG capsule Take 1 capsule (100 mg total) by mouth every 8 (eight) hours. 01/24/17   Rise MuLeaphart, Kenneth T, PA-C  fluticasone (FLONASE) 50 MCG/ACT nasal spray Place 2 sprays into both nostrils daily. 01/24/17   Rise MuLeaphart, Kenneth T, PA-C  guaiFENesin (ROBITUSSIN) 100 MG/5ML liquid Take 5-10 mLs (100-200 mg total) by mouth every 4 (four) hours as needed for congestion. Patient not taking: Reported on 01/26/2016 05/27/15   Fayrene Helperran, Ryot Burrous, PA-C  naproxen (NAPROSYN) 375 MG tablet  Take 1 tablet (375 mg total) by mouth 2 (two) times daily. 01/24/17   Rise MuLeaphart, Kenneth T, PA-C  omeprazole (PRILOSEC) 20 MG capsule Take 1 capsule (20 mg total) by mouth daily. 01/24/17   Rise MuLeaphart, Kenneth T, PA-C  promethazine-dextromethorphan (PROMETHAZINE-DM) 6.25-15 MG/5ML syrup Take 5 mLs by mouth 4 (four) times daily as needed for cough. Patient not taking: Reported on 01/26/2016 05/27/15   Fayrene Helperran, Yoseline Andersson, PA-C    Family History Family History  Problem Relation Age of Onset  . Hypertension Other     Social History Social History   Tobacco Use  . Smoking status: Current Some Day Smoker    Types: Cigars  . Smokeless tobacco: Never Used  Substance Use Topics  . Alcohol use: Yes    Comment: Occasional  . Drug use: No     Allergies   Patient has no known allergies.   Review of Systems Review of Systems  Constitutional: Negative for fever.  Musculoskeletal: Positive for arthralgias and joint swelling.  Skin: Negative for rash and wound.  Neurological: Negative for numbness.     Physical Exam Updated Vital Signs BP 133/84 (BP Location: Right Arm)   Pulse 96   Temp 98.6 F (37 C) (Oral)   Resp 15   Ht 5\' 7"  (1.702 m)   Wt 54.4 kg (120 lb)   LMP 03/14/2017   SpO2 100%   BMI 18.79  kg/m   Physical Exam  Constitutional: She appears well-developed and well-nourished. No distress.  HENT:  Head: Atraumatic.  Eyes: Conjunctivae are normal.  Neck: Neck supple.  Musculoskeletal: She exhibits tenderness (Left ankle: Tenderness and swelling noted to the lateral malleoli region with decreased range of motion secondary to pain.  No crepitus, no deformity.  Dorsalis pedis pulse palpable.  Brisk cap refill to all toes.).  Left knee and left hip to nontender.  Neurological: She is alert.  Skin: No rash noted.  Psychiatric: She has a normal mood and affect.  Nursing note and vitals reviewed.    ED Treatments / Results  Labs (all labs ordered are listed, but only abnormal  results are displayed) Labs Reviewed - No data to display  EKG  EKG Interpretation None       Radiology Dg Ankle Complete Left  Result Date: 03/31/2017 CLINICAL DATA:  Pain with weight-bearing after twisting injury to the left ankle EXAM: LEFT ANKLE COMPLETE - 3+ VIEW COMPARISON:  None. FINDINGS: There is no evidence of fracture, dislocation, or joint effusion. There is no evidence of arthropathy or other focal bone abnormality. Soft tissues are unremarkable. IMPRESSION: Negative. Electronically Signed   By: Ellery Plunkaniel R Mitchell M.D.   On: 03/31/2017 01:21    Procedures Procedures (including critical care time)  Medications Ordered in ED Medications  ibuprofen (ADVIL,MOTRIN) tablet 800 mg (not administered)     Initial Impression / Assessment and Plan / ED Course  I have reviewed the triage vital signs and the nursing notes.  Pertinent labs & imaging results that were available during my care of the patient were reviewed by me and considered in my medical decision making (see chart for details).     BP 133/84 (BP Location: Right Arm)   Pulse 96   Temp 98.6 F (37 C) (Oral)   Resp 15   Ht 5\' 7"  (1.702 m)   Wt 54.4 kg (120 lb)   LMP 03/14/2017   SpO2 100%   BMI 18.79 kg/m    Final Clinical Impressions(s) / ED Diagnoses   Final diagnoses:  Sprain of left ankle, unspecified ligament, initial encounter    ED Discharge Orders        Ordered    naproxen (NAPROSYN) 375 MG tablet  2 times daily     03/31/17 0234     2:16 AM Patient injured her left ankle 3 days prior.  She does have swelling to her lateral malleolar region x-ray of the left ankle was negative for acute fractures or dislocation.  She is neurovascularly intact.  Will provide ASO brace, crutches, and discussed rice therapy.  Ibuprofen given.   Fayrene Helperran, Jesiel Garate, PA-C 03/31/17 [id number]    Geoffery Lyonselo, [place], MD 03/31/17 [phone]

## 2017-07-13 ENCOUNTER — Emergency Department (HOSPITAL_COMMUNITY)
Admission: EM | Admit: 2017-07-13 | Discharge: 2017-07-13 | Disposition: A | Discharge status: Home / Self Care | Payer: Medicaid Other | Attending: Emergency Medicine | Admitting: Emergency Medicine

## 2017-07-13 ENCOUNTER — Other Ambulatory Visit: Payer: Self-pay

## 2017-07-13 DIAGNOSIS — J029 Acute pharyngitis, unspecified: Secondary | ICD-10-CM | POA: Insufficient documentation

## 2017-07-13 DIAGNOSIS — F17210 Nicotine dependence, cigarettes, uncomplicated: Secondary | ICD-10-CM | POA: Insufficient documentation

## 2017-07-13 DIAGNOSIS — Z79899 Other long term (current) drug therapy: Secondary | ICD-10-CM | POA: Diagnosis not present

## 2017-07-13 LAB — RAPID STREP SCREEN (MED CTR MEBANE ONLY): STREPTOCOCCUS, GROUP A SCREEN (DIRECT): NEGATIVE

## 2017-07-13 MED ORDER — LIDOCAINE VISCOUS 2 % MT SOLN: 15.0000 mL | OROMUCOSAL | 0 refills | Status: DC | PRN

## 2017-07-13 MED ORDER — ACETAMINOPHEN 500 MG PO TABS
1000.0000 mg | ORAL_TABLET | Freq: Once | ORAL | Status: AC
  Administered 2017-07-13: 1000 mg via ORAL
  Filled 2017-07-13: qty 2

## 2017-07-13 MED ORDER — IBUPROFEN 600 MG PO TABS: 600.0000 mg | ORAL_TABLET | Freq: Four times a day (QID) | ORAL | 0 refills | Status: DC | PRN

## 2017-07-13 NOTE — ED Triage Notes (Signed)
Pt reports sore throat started today pain with swallowing and yawning.

## 2017-07-13 NOTE — ED Provider Notes (Signed)
[facility] EMERGENCY DEPARTMENT Provider Note   CSN: [id number] Arrival date & time: 07/13/17  1323     History   Chief Complaint Chief Complaint  Patient presents with  . Sore Throat    HPI Mackenzie Schultz is a 22 y.o. female who presents the emergency department today for sore throat.  Patient states that 2-3 days ago she started developing sore throat with associated dysphasia and subjective fevers.  Patient has not been taking anything at home for symptoms.  She notes that swallowing and yawning make her sore throat worse.  She denies any sick contacts. Denies chills, inability to control secretions, N/V, abdominal pain, cough, congestion, voice change, dental disease or trauma.   HPI  Past Medical History:  Diagnosis Date  . Heart murmur   . Panic attacks     There are no active problems to display for this patient.   No past surgical history on file.   OB History   None      Home Medications    Prior to Admission medications   Medication Sig Start Date End Date Taking? Authorizing Provider  amoxicillin-clavulanate (AUGMENTIN) 875-125 MG tablet Take 1 tablet by mouth 2 (two) times daily. One po bid x 7 days Patient not taking: Reported on 01/26/2016 02/03/15   Arthor Captain, PA-C  benzonatate (TESSALON) 100 MG capsule Take 1 capsule (100 mg total) by mouth every 8 (eight) hours. 01/24/17   Rise Mu, PA-C  fluticasone (FLONASE) 50 MCG/ACT nasal spray Place 2 sprays into both nostrils daily. 01/24/17   Rise Mu, PA-C  guaiFENesin (ROBITUSSIN) 100 MG/5ML liquid Take 5-10 mLs (100-200 mg total) by mouth every 4 (four) hours as needed for congestion. Patient not taking: Reported on 01/26/2016 05/27/15   Fayrene Helper, PA-C  naproxen (NAPROSYN) 375 MG tablet Take 1 tablet (375 mg total) by mouth 2 (two) times daily. 03/31/17   Fayrene Helper, PA-C  omeprazole (PRILOSEC) 20 MG capsule Take 1 capsule (20 mg total) by mouth daily.  01/24/17   Rise Mu, PA-C  promethazine-dextromethorphan (PROMETHAZINE-DM) 6.25-15 MG/5ML syrup Take 5 mLs by mouth 4 (four) times daily as needed for cough. Patient not taking: Reported on 01/26/2016 05/27/15   Fayrene Helper, PA-C    Family History Family History  Problem Relation Age of Onset  . Hypertension Other     Social History Social History   Tobacco Use  . Smoking status: Current Some Day Smoker    Types: Cigars  . Smokeless tobacco: Never Used  Substance Use Topics  . Alcohol use: Yes    Comment: Occasional  . Drug use: No     Allergies   Patient has no known allergies.   Review of Systems Review of Systems  All other systems reviewed and are negative.    Physical Exam Updated Vital Signs BP (!) 137/91 (BP Location: Right Arm)   Pulse 82   Temp 100 F (37.8 C) (Oral)   Resp 16   SpO2 100%   Physical Exam  Constitutional: She appears well-developed and well-nourished. No distress.  HENT:  Head: Normocephalic and atraumatic.  Right Ear: Hearing, tympanic membrane, external ear and ear canal normal. No foreign bodies. Tympanic membrane is not injected, not perforated, not erythematous, not retracted and not bulging.  Left Ear: Hearing, tympanic membrane, external ear and ear canal normal. No foreign bodies. Tympanic membrane is not injected, not perforated, not erythematous, not retracted and not bulging.  Nose: Nose normal. No  mucosal edema, rhinorrhea, sinus tenderness, septal deviation or nasal septal hematoma.  No foreign bodies. Right sinus exhibits no maxillary sinus tenderness and no frontal sinus tenderness. Left sinus exhibits no maxillary sinus tenderness and no frontal sinus tenderness.  The patient has normal phonation and is in control of secretions. No stridor.  Midline uvula without edema. Soft palate rises symmetrically. Mild tonsillar erythema without hypertrophy or exudates. No PTA. Tongue protrusion is normal. No trismus. No creptius  on neck palpation and patient has good dentition. No gingival erythema or fluctuance noted. Mucus membranes moist.  Eyes: Pupils are equal, round, and reactive to light. Conjunctivae, EOM and lids are normal. Right eye exhibits no discharge. Left eye exhibits no discharge. Right conjunctiva is not injected. Left conjunctiva is not injected.  Neck: Trachea normal, normal range of motion, full passive range of motion without pain and phonation normal. Neck supple. No spinous process tenderness and no muscular tenderness present. No neck rigidity. No tracheal deviation and normal range of motion present.  No nuchal rigidity or meningismus  Cardiovascular: Normal rate and regular rhythm.  Pulmonary/Chest: Effort normal and breath sounds normal. No stridor. She has no wheezes.  Abdominal: Soft.  Lymphadenopathy:       Head (right side): No submental, no submandibular, no tonsillar, no preauricular, no posterior auricular and no occipital adenopathy present.       Head (left side): No submental, no submandibular, no tonsillar, no preauricular, no posterior auricular and no occipital adenopathy present.    She has cervical adenopathy.  Neurological: She is alert.  Skin: Skin is warm, dry and intact. No petechiae, no purpura and no rash noted.  Psychiatric: She has a normal mood and affect.  Nursing note and vitals reviewed.    ED Treatments / Results  Labs (all labs ordered are listed, but only abnormal results are displayed) Labs Reviewed  RAPID STREP SCREEN (NOT AT [facility])  CULTURE, GROUP A STREP [facility]    EKG None  Radiology No results found.  Procedures Procedures (including critical care time)  Medications Ordered in ED Medications  acetaminophen (TYLENOL) tablet 1,000 mg (1,000 mg Oral Given 07/13/17 1428)     Initial Impression / Assessment and Plan / ED Course  I have reviewed the triage vital signs and the nursing notes.  Pertinent labs & imaging results that were available  during my care of the patient were reviewed by me and considered in my medical decision making (see chart for details).     22 y.o. female with ST and dysphagia. Pt afebrile without tonsillar exudate, negative strep. Presents with mild cervical lymphadenopathy, & dysphagia; diagnosis of viral pharyngitis. No abx indicated. Discharged with symptomatic tx for pain  Pt does not appear dehydrated, but did discuss importance of water rehydration. Presentation non concerning for PTA or RPA. No trismus or uvula deviation. Specific return precautions discussed. Pt able to drink water in ED without difficulty with intact air way. Recommended PCP follow up.  Final Clinical Impressions(s) / ED Diagnoses   Final diagnoses:  Viral pharyngitis    ED Discharge Orders        Ordered    ibuprofen (ADVIL,MOTRIN) 600 MG tablet  Every 6 hours PRN     07/13/17 1432    lidocaine (XYLOCAINE) 2 % solution  As needed     07/13/17 1432       Princella PellegriniMaczis, Dream Harman M, PA-C 07/13/17 1432    Tegeler, Canary Brimhristopher J, MD 07/13/17 Barry Brunner1935

## 2017-07-13 NOTE — Discharge Instructions (Signed)
Please read and follow all provided instructions.  Your diagnoses today include: Viral Pharyngitis   Tests performed today include: Vital signs. See below for your results today.  Strep Test: This was negative. A throat culture was sent as a precaution and results will be available in 2-3 days. If it returns positive for strep, you will be called by our flow manager for further instructions.  Medications prescribed:  1. Take ibuprofen as directed with meals. Do not take if you think you may be pregnant.  2. Take viscous lidocaine as directed. Swish, gargle and spit. Do not swallow the solution.   Home care instructions:  At this time, it appears that your sore throat is caused by a viral infection. Antibiotics do NOT help a viral infection and can cause unwanted side effects. The fever should resolve in 2-3 days and sore throat should begin to resolve in 2-3 days as well. Continued to alternate between Tylenol and ibuprofen for pain. May consider over-the-counter Benadryl for additional relief (decrease secretions). Also discard your toothbrush and begin using a new one in 3 days.   Follow-up instructions: Please follow-up with your primary care provider in 2-3 days for follow up.   Return instructions:  Return to the ED sooner for worsening condition, inability to swallow, breathing difficulty, new concerns.  Additional Information:  Your vital signs today were: BP (!) 137/91 (BP Location: Right Arm)    Pulse 82    Temp 100 F (37.8 C) (Oral)    Resp 16    SpO2 100%  If your blood pressure (BP) was elevated above 135/85 this visit, please have this repeated by your doctor within one month. ---------------

## 2017-07-15 LAB — CULTURE, GROUP A STREP (THRC)

## 2017-08-01 ENCOUNTER — Encounter (HOSPITAL_COMMUNITY): Payer: Self-pay | Admitting: Emergency Medicine

## 2017-08-01 ENCOUNTER — Emergency Department (HOSPITAL_COMMUNITY)
Admission: EM | Admit: 2017-08-01 | Discharge: 2017-08-01 | Discharge status: Against Medical Advice | Payer: Medicaid Other | Attending: Emergency Medicine | Admitting: Emergency Medicine

## 2017-08-01 DIAGNOSIS — R51 Headache: Secondary | ICD-10-CM | POA: Diagnosis present

## 2017-08-01 DIAGNOSIS — Z5321 Procedure and treatment not carried out due to patient leaving prior to being seen by health care provider: Secondary | ICD-10-CM | POA: Diagnosis not present

## 2017-08-01 NOTE — ED Triage Notes (Signed)
Patient c/o headache x2 days. Reports intermittent headache x2 weeks. Reports light sensitivity and nausea. Hx migraines.

## 2017-08-01 NOTE — ED Triage Notes (Signed)
Pt states she was leaving because she was tired of waiting and her friend was discharged from fast track

## 2017-10-01 ENCOUNTER — Emergency Department (HOSPITAL_COMMUNITY): Payer: Medicaid Other

## 2017-10-01 ENCOUNTER — Encounter (HOSPITAL_COMMUNITY): Payer: Self-pay | Admitting: Emergency Medicine

## 2017-10-01 ENCOUNTER — Emergency Department (HOSPITAL_COMMUNITY)
Admission: EM | Admit: 2017-10-01 | Discharge: 2017-10-01 | Disposition: A | Discharge status: Home / Self Care | Payer: Medicaid Other | Attending: Emergency Medicine | Admitting: Emergency Medicine

## 2017-10-01 DIAGNOSIS — Z87891 Personal history of nicotine dependence: Secondary | ICD-10-CM | POA: Diagnosis not present

## 2017-10-01 DIAGNOSIS — Y9289 Other specified places as the place of occurrence of the external cause: Secondary | ICD-10-CM | POA: Insufficient documentation

## 2017-10-01 DIAGNOSIS — Y9389 Activity, other specified: Secondary | ICD-10-CM | POA: Diagnosis not present

## 2017-10-01 DIAGNOSIS — S0083XA Contusion of other part of head, initial encounter: Secondary | ICD-10-CM | POA: Diagnosis not present

## 2017-10-01 DIAGNOSIS — J45909 Unspecified asthma, uncomplicated: Secondary | ICD-10-CM | POA: Insufficient documentation

## 2017-10-01 DIAGNOSIS — S0181XA Laceration without foreign body of other part of head, initial encounter: Secondary | ICD-10-CM | POA: Insufficient documentation

## 2017-10-01 DIAGNOSIS — Y999 Unspecified external cause status: Secondary | ICD-10-CM | POA: Insufficient documentation

## 2017-10-01 DIAGNOSIS — S060X1A Concussion with loss of consciousness of 30 minutes or less, initial encounter: Secondary | ICD-10-CM | POA: Diagnosis not present

## 2017-10-01 DIAGNOSIS — S0990XA Unspecified injury of head, initial encounter: Secondary | ICD-10-CM | POA: Diagnosis present

## 2017-10-01 HISTORY — DX: Unspecified asthma, uncomplicated: J45.909

## 2017-10-01 HISTORY — DX: Essential (primary) hypertension: I10

## 2017-10-01 MED ORDER — NAPROXEN 250 MG PO TABS
500.0000 mg | ORAL_TABLET | Freq: Once | ORAL | Status: AC
  Administered 2017-10-01: 500 mg via ORAL
  Filled 2017-10-01: qty 2

## 2017-10-01 MED ORDER — OXYCODONE-ACETAMINOPHEN 5-325 MG PO TABS
1.0000 | ORAL_TABLET | Freq: Once | ORAL | Status: AC
  Administered 2017-10-01: 1 via ORAL
  Filled 2017-10-01: qty 1

## 2017-10-01 MED ORDER — IBUPROFEN 600 MG PO TABS: 600.0000 mg | ORAL_TABLET | Freq: Four times a day (QID) | ORAL | 0 refills | Status: DC | PRN

## 2017-10-01 NOTE — ED Notes (Signed)
Patient transported to CT 
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
## 2017-10-01 NOTE — ED Notes (Signed)
Please update mom with information. Her name is Mackenzie Schultz [phone] or [phone]; mom will provide SN with code of [id number] to discuss patient information. Patient provided consent to discuss information with mom.

## 2017-10-01 NOTE — ED Notes (Signed)
Patient verbalizes understanding of discharge instructions. Opportunity for questioning and answers were provided. Armband removed by staff, pt discharged from ED ambulatory.   
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
## 2018-01-25 ENCOUNTER — Emergency Department (HOSPITAL_COMMUNITY)
Admission: EM | Admit: 2018-01-25 | Discharge: 2018-01-26 | Disposition: A | Discharge status: Home / Self Care | Payer: Medicaid Other | Attending: Emergency Medicine | Admitting: Emergency Medicine

## 2018-01-25 ENCOUNTER — Encounter (HOSPITAL_COMMUNITY): Payer: Self-pay | Admitting: Emergency Medicine

## 2018-01-25 DIAGNOSIS — Z79899 Other long term (current) drug therapy: Secondary | ICD-10-CM | POA: Insufficient documentation

## 2018-01-25 DIAGNOSIS — I10 Essential (primary) hypertension: Secondary | ICD-10-CM | POA: Insufficient documentation

## 2018-01-25 DIAGNOSIS — J45909 Unspecified asthma, uncomplicated: Secondary | ICD-10-CM | POA: Diagnosis not present

## 2018-01-25 DIAGNOSIS — Z87891 Personal history of nicotine dependence: Secondary | ICD-10-CM | POA: Insufficient documentation

## 2018-01-25 DIAGNOSIS — R55 Syncope and collapse: Secondary | ICD-10-CM | POA: Diagnosis present

## 2018-01-25 LAB — I-STAT TROPONIN, ED: Troponin i, poc: 0 ng/mL (ref 0.00–0.08)

## 2018-01-25 LAB — URINALYSIS, ROUTINE W REFLEX MICROSCOPIC
BILIRUBIN URINE: NEGATIVE
GLUCOSE, UA: NEGATIVE mg/dL
KETONES UR: NEGATIVE mg/dL
NITRITE: NEGATIVE
PH: 6 (ref 5.0–8.0)
Protein, ur: NEGATIVE mg/dL
Specific Gravity, Urine: 1.006 (ref 1.005–1.030)

## 2018-01-25 LAB — BASIC METABOLIC PANEL
Anion gap: 9 (ref 5–15)
BUN: 18 mg/dL (ref 6–20)
CHLORIDE: 103 mmol/L (ref 98–111)
CO2: 25 mmol/L (ref 22–32)
Calcium: 9.1 mg/dL (ref 8.9–10.3)
Creatinine, Ser: 1.03 mg/dL — ABNORMAL HIGH (ref 0.44–1.00)
GFR calc non Af Amer: >60 mL/min (ref >60)
Glucose, Bld: 96 mg/dL (ref 70–99)
POTASSIUM: 3.7 mmol/L (ref 3.5–5.1)
SODIUM: 137 mmol/L (ref 135–145)

## 2018-01-25 LAB — RAPID URINE DRUG SCREEN, HOSP PERFORMED
AMPHETAMINES: NOT DETECTED
BENZODIAZEPINES: NOT DETECTED
Barbiturates: NOT DETECTED
COCAINE: NOT DETECTED
Opiates: NOT DETECTED
TETRAHYDROCANNABINOL: NOT DETECTED

## 2018-01-25 LAB — CBC
HEMATOCRIT: 41.4 % (ref 36.0–46.0)
HEMOGLOBIN: 13.4 g/dL (ref 12.0–15.0)
MCH: 29.2 pg (ref 26.0–34.0)
MCHC: 32.4 g/dL (ref 30.0–36.0)
MCV: 90.2 fL (ref 80.0–100.0)
Platelets: 307 10*3/uL (ref 150–400)
RBC: 4.59 MIL/uL (ref 3.87–5.11)
RDW: 12.3 % (ref 11.5–15.5)
WBC: 9.5 10*3/uL (ref 4.0–10.5)
nRBC: 0 % (ref 0.0–0.2)

## 2018-01-25 LAB — I-STAT BETA HCG BLOOD, ED (MC, WL, AP ONLY): I-stat hCG, quantitative: <5 m[IU]/mL (ref <5)

## 2018-01-25 NOTE — Discharge Instructions (Signed)
Get help right away if: °You have a severe headache. °You have unusual pain in your chest, abdomen, or back. °You are bleeding from your mouth or rectum, or you have black or tarry stool. °You have a very fast or irregular heartbeat (palpitations). °You faint once or repeatedly. °You have a seizure. °You are confused. °You have trouble walking. °You have severe weakness. °You have vision problems. °
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
## 2018-01-25 NOTE — ED Notes (Signed)
Pt reports she passed out on 2 occassions due to a fever.

## 2018-01-26 NOTE — ED Notes (Signed)
Patient verbalizes understanding of discharge instructions. Opportunity for questioning and answers were provided. Armband removed by staff, pt discharged from ED home via POV.  

## 2018-03-21 ENCOUNTER — Ambulatory Visit (INDEPENDENT_AMBULATORY_CARE_PROVIDER_SITE_OTHER): Payer: Medicaid Other | Admitting: Obstetrics and Gynecology

## 2018-03-21 ENCOUNTER — Encounter: Payer: Self-pay | Admitting: Obstetrics and Gynecology

## 2018-03-21 VITALS — BP 141/79 | HR 72 | Ht 64.0 in | Wt 159.3 lb

## 2018-03-21 DIAGNOSIS — Z3046 Encounter for surveillance of implantable subdermal contraceptive: Secondary | ICD-10-CM | POA: Diagnosis not present

## 2018-03-21 NOTE — Progress Notes (Signed)
22 yo here for Nexplanon removal. Patient is in a same sex relationship and is not interested in contraception at this time   Removal Patient given informed consent for removal of her Nexplanon, time out was performed.  Signed copy in the chart.  Appropriate time out taken. Nexplanon site identified.  Area prepped in usual sterile fashon. One cc of 1% lidocaine was used to anesthetize the area at the distal end of the implant. A small stab incision was made right beside the implant on the distal portion.  The nexplanon rod was grasped using hemostats and removed without difficulty.  There was less than 3 cc blood loss. There were no complications.  A small amount of antibiotic ointment and steri-strips were applied over the small incision.  A pressure bandage was applied to reduce any bruising.  The patient tolerated the procedure well and was given post procedure instructions.  Patient is scheduled to have pap smear with PCP

## 2018-03-21 NOTE — Progress Notes (Signed)
Pt is here for nexplanon removal, she states she has had it for 3 years and is not interested in any other contraception at this time.

## 2018-06-04 ENCOUNTER — Encounter (HOSPITAL_COMMUNITY): Payer: Self-pay

## 2018-06-04 ENCOUNTER — Emergency Department (HOSPITAL_COMMUNITY): Payer: Medicaid Other

## 2018-06-04 ENCOUNTER — Other Ambulatory Visit: Payer: Self-pay

## 2018-06-04 ENCOUNTER — Emergency Department (HOSPITAL_COMMUNITY)
Admission: EM | Admit: 2018-06-04 | Discharge: 2018-06-04 | Disposition: A | Discharge status: Home / Self Care | Payer: Medicaid Other | Attending: Emergency Medicine | Admitting: Emergency Medicine

## 2018-06-04 DIAGNOSIS — M79604 Pain in right leg: Secondary | ICD-10-CM | POA: Diagnosis not present

## 2018-06-04 DIAGNOSIS — Z87891 Personal history of nicotine dependence: Secondary | ICD-10-CM | POA: Diagnosis not present

## 2018-06-04 DIAGNOSIS — M79605 Pain in left leg: Secondary | ICD-10-CM | POA: Insufficient documentation

## 2018-06-04 DIAGNOSIS — J45909 Unspecified asthma, uncomplicated: Secondary | ICD-10-CM | POA: Insufficient documentation

## 2018-06-04 DIAGNOSIS — I10 Essential (primary) hypertension: Secondary | ICD-10-CM | POA: Diagnosis not present

## 2018-06-04 DIAGNOSIS — Z79899 Other long term (current) drug therapy: Secondary | ICD-10-CM | POA: Insufficient documentation

## 2018-06-04 LAB — CBC WITH DIFFERENTIAL/PLATELET
ABS IMMATURE GRANULOCYTES: 0.03 10*3/uL (ref 0.00–0.07)
BASOS PCT: 0 %
Basophils Absolute: 0 10*3/uL (ref 0.0–0.1)
Eosinophils Absolute: 0.1 10*3/uL (ref 0.0–0.5)
Eosinophils Relative: 1 %
HCT: 37.6 % (ref 36.0–46.0)
HEMOGLOBIN: 12.1 g/dL (ref 12.0–15.0)
IMMATURE GRANULOCYTES: 0 %
LYMPHS PCT: 21 %
Lymphs Abs: 1.8 10*3/uL (ref 0.7–4.0)
MCH: 30 pg (ref 26.0–34.0)
MCHC: 32.2 g/dL (ref 30.0–36.0)
MCV: 93.3 fL (ref 80.0–100.0)
MONO ABS: 0.6 10*3/uL (ref 0.1–1.0)
MONOS PCT: 7 %
NEUTROS ABS: 5.8 10*3/uL (ref 1.7–7.7)
NEUTROS PCT: 71 %
PLATELETS: 214 10*3/uL (ref 150–400)
RBC: 4.03 MIL/uL (ref 3.87–5.11)
RDW: 12.3 % (ref 11.5–15.5)
WBC: 8.3 10*3/uL (ref 4.0–10.5)
nRBC: 0 % (ref 0.0–0.2)

## 2018-06-04 LAB — COMPREHENSIVE METABOLIC PANEL
ALBUMIN: 3.9 g/dL (ref 3.5–5.0)
ALK PHOS: 64 U/L (ref 38–126)
ALT: 19 U/L (ref 0–44)
AST: 22 U/L (ref 15–41)
Anion gap: 6 (ref 5–15)
BUN: 16 mg/dL (ref 6–20)
CALCIUM: 8.8 mg/dL — AB (ref 8.9–10.3)
CHLORIDE: 108 mmol/L (ref 98–111)
CO2: 24 mmol/L (ref 22–32)
CREATININE: 0.84 mg/dL (ref 0.44–1.00)
GFR calc Af Amer: >60 mL/min (ref >60)
GFR calc non Af Amer: >60 mL/min (ref >60)
GLUCOSE: 99 mg/dL (ref 70–99)
Potassium: 3.7 mmol/L (ref 3.5–5.1)
SODIUM: 138 mmol/L (ref 135–145)
Total Bilirubin: 0.2 mg/dL — ABNORMAL LOW (ref 0.3–1.2)
Total Protein: 6.6 g/dL (ref 6.5–8.1)

## 2018-06-04 LAB — I-STAT BETA HCG BLOOD, ED (MC, WL, AP ONLY): I-stat hCG, quantitative: <5 m[IU]/mL (ref <5)

## 2018-06-04 LAB — CBG MONITORING, ED: Glucose-Capillary: 88 mg/dL (ref 70–99)

## 2018-06-04 MED ORDER — CELECOXIB 100 MG PO CAPS: ORAL_CAPSULE | ORAL | 1 refills | Status: DC

## 2018-06-04 NOTE — ED Triage Notes (Signed)
Patient BIB EMS from work at [employer], where she worked third shift last night. Patient complaining of bilateral leg pain, since 2300 last night. Patient describes the pain as "throbbing/stabbing." Patient reports she has had leg pain previously and she has noted increased stressors and lack of sleep triggers her leg pain. Patient ambulatory in triage without assistance, but with increased pain. Patient also complaining of headache. Patient AxOx4.

## 2018-06-04 NOTE — ED Provider Notes (Signed)
[facility]-EMERGENCY DEPT Provider Note   CSN: [id number] Arrival date & time: 06/04/18  1157     History   Chief Complaint Chief Complaint  Patient presents with  . Leg Pain    HPI Mackenzie Schultz is a 23 y.o. female.  Patient complains of bilateral leg pain.  She states that she stands all night long at work.  The history is provided by the patient. No language interpreter was used.  Leg Pain  Lower extremity pain location: Both legs. Injury: no   Pain details:    Quality:  Aching   Severity:  Moderate   Onset quality:  Gradual   Timing:  Constant   Progression:  Waxing and waning Chronicity:  Recurrent Dislocation: no   Foreign body present:  No foreign bodies Associated symptoms: no back pain and no fatigue     Past Medical History:  Diagnosis Date  . Asthma   . Heart murmur   . Hypertension   . Panic attacks     There are no active problems to display for this patient.   History reviewed. No pertinent surgical history.   OB History    Gravida  0   Para  0   Term  0   Preterm  0   AB  0   Living  0     SAB  0   TAB  0   Ectopic  0   Multiple  0   Live Births  0            Home Medications    Prior to Admission medications   Medication Sig Start Date End Date Taking? Authorizing Provider  acetaminophen (TYLENOL) 325 MG tablet Take 650 mg by mouth every 6 (six) hours as needed for mild pain or headache.   Yes [provider]  PROAIR HFA 108 (90 Base) MCG/ACT inhaler 1-2 puffs every 4 (four) hours as needed for wheezing or shortness of breath.  02/01/18  Yes [provider]  amoxicillin-clavulanate (AUGMENTIN) 875-125 MG tablet Take 1 tablet by mouth 2 (two) times daily. One po bid x 7 days Patient not taking: Reported on 01/26/2016 02/03/15   Arthor Captain, PA-C  benzonatate (TESSALON) 100 MG capsule Take 1 capsule (100 mg total) by mouth every 8 (eight) hours. Patient not taking:  Reported on 03/21/2018 01/24/17   Demetrios Loll T, PA-C  celecoxib (CELEBREX) 100 MG capsule Take 1 every 12 hours as needed for discomfort 06/04/18   Bethann Berkshire, MD  fluticasone [facility]) 50 MCG/ACT nasal spray Place 2 sprays into both nostrils daily. Patient not taking: Reported on 03/21/2018 01/24/17   Demetrios Loll T, PA-C  guaiFENesin (ROBITUSSIN) 100 MG/5ML liquid Take 5-10 mLs (100-200 mg total) by mouth every 4 (four) hours as needed for congestion. Patient not taking: Reported on 01/26/2016 05/27/15   Fayrene Helper, PA-C  ibuprofen (ADVIL,MOTRIN) 600 MG tablet Take 1 tablet (600 mg total) by mouth every 6 (six) hours as needed. Patient not taking: Reported on 03/21/2018 10/01/17   Derwood Kaplan, MD  lidocaine (XYLOCAINE) 2 % solution Use as directed 15 mLs in the mouth or throat as needed for mouth pain. Patient not taking: Reported on 03/21/2018 07/13/17   Maczis, Elmer Sow, PA-C  naproxen (NAPROSYN) 375 MG tablet Take 1 tablet (375 mg total) by mouth 2 (two) times daily. Patient not taking: Reported on 03/21/2018 03/31/17   Fayrene Helper, PA-C  omeprazole (PRILOSEC) 20 MG capsule Take 1 capsule (20  mg total) by mouth daily. Patient not taking: Reported on 03/21/2018 01/24/17   Demetrios LollLeaphart, Kenneth T, PA-C  promethazine-dextromethorphan (PROMETHAZINE-DM) 6.25-15 MG/5ML syrup Take 5 mLs by mouth 4 (four) times daily as needed for cough. Patient not taking: Reported on 01/26/2016 05/27/15   Fayrene Helperran, [place], PA-C    Family History Family History  Problem Relation Age of Onset  . Hypertension Other   . Hypertension Mother   . Hypertension Father     Social History Social History   Tobacco Use  . Smoking status: Former Smoker    Types: Cigars  . Smokeless tobacco: Never Used  Substance Use Topics  . Alcohol use: Not Currently    Comment: Occasional  . Drug use: No     Allergies   Ibuprofen and Mushroom extract complex   Review of Systems Review of Systems  Constitutional:  Negative for appetite change and fatigue.  HENT: Negative for congestion, ear discharge and sinus pressure.   Eyes: Negative for discharge.  Respiratory: Negative for cough.   Cardiovascular: Negative for chest pain.  Gastrointestinal: Negative for abdominal pain and diarrhea.  Genitourinary: Negative for frequency and hematuria.  Musculoskeletal: Negative for back pain.       Bilateral leg pain  Skin: Negative for rash.  Neurological: Negative for seizures and headaches.  Psychiatric/Behavioral: Negative for hallucinations.     Physical Exam Updated Vital Signs BP 122/84   Pulse 78   Temp 98.3 F (36.8 C) (Oral)   Resp 19   LMP 05/14/2018 (Approximate) Comment: -hcg 06/04/2018  SpO2 100%   Physical Exam Vitals signs and nursing note reviewed.  Constitutional:      Appearance: She is well-developed.  HENT:     Head: Normocephalic.  Eyes:     General: No scleral icterus.    Conjunctiva/sclera: Conjunctivae normal.  Neck:     Musculoskeletal: Neck supple.     Thyroid: No thyromegaly.  Cardiovascular:     Rate and Rhythm: Normal rate and regular rhythm.     Heart sounds: No murmur. No friction rub. No gallop.   Pulmonary:     Breath sounds: No stridor. No wheezing or rales.  Chest:     Chest wall: No tenderness.  Abdominal:     General: There is no distension.     Tenderness: There is no abdominal tenderness. There is no rebound.  Musculoskeletal: Normal range of motion.        General: Tenderness present.  Lymphadenopathy:     Cervical: No cervical adenopathy.  Skin:    Findings: No erythema or rash.  Neurological:     Mental Status: She is alert and oriented to person, place, and time.     Motor: No abnormal muscle tone.     Coordination: Coordination normal.  Psychiatric:        Behavior: Behavior normal.      ED Treatments / Results  Labs (all labs ordered are listed, but only abnormal results are displayed) Labs Reviewed  COMPREHENSIVE METABOLIC  PANEL - Abnormal; Notable for the following components:      Result Value   Calcium 8.8 (*)    Total Bilirubin 0.2 (*)    All other components within normal limits  CBC WITH DIFFERENTIAL/PLATELET  CBG MONITORING, ED  I-STAT BETA HCG BLOOD, ED ([facility], [facility], [facility] ONLY)    EKG None  Radiology Dg Lumbar Spine Complete  Result Date: 06/04/2018 CLINICAL DATA:  Low back pain with bilateral leg numbness EXAM: LUMBAR SPINE - COMPLETE 4+  VIEW COMPARISON:  None. FINDINGS: There is no evidence of lumbar spine fracture. Alignment is normal. Intervertebral disc spaces are maintained. IMPRESSION: Negative. Electronically Signed   By: Marnee Spring M.D.   On: 06/04/2018 09:06    Procedures Procedures (including critical care time)  Medications Ordered in ED Medications - No data to display   Initial Impression / Assessment and Plan / ED Course  I have reviewed the triage vital signs and the nursing notes.  Pertinent labs & imaging results that were available during my care of the patient were reviewed by me and considered in my medical decision making (see chart for details).     Labs and x-rays unremarkable.  Patient given Celebrex to help with her discomfort from standing a lot at work  Final Clinical Impressions(s) / ED Diagnoses   Final diagnoses:  Bilateral leg pain    ED Discharge Orders         Ordered    celecoxib (CELEBREX) 100 MG capsule     06/04/18 0954           Bethann Berkshire, MD 06/04/18 [phone]

## 2018-06-04 NOTE — ED Notes (Signed)
Bed: WA07 Expected date:  Expected time:  Means of arrival:  Comments: EMS 23 yo female from place of work-2300 last night-leg pain and anxiety-lack of sleep/HA

## 2018-06-04 NOTE — ED Notes (Signed)
Pt ambulated to restroom upon arrival EMS. Pt did not feel dizzy or look unsteady.

## 2018-06-04 NOTE — ED Notes (Signed)
UA sample is at bedside.

## 2018-06-04 NOTE — ED Provider Notes (Signed)
[facility]-EMERGENCY DEPT Provider Note   CSN: [id number] Arrival date & time: 06/04/18  3094     History   Chief Complaint Chief Complaint  Patient presents with  . Leg Pain    HPI Mackenzie Schultz is a 23 y.o. female.  Patient complains of bilateral leg pain.  She is been standing up all day at work  The history is provided by the patient. No language interpreter was used.  Leg Pain  Location:  Leg Injury: no   Leg location:  L leg and R leg Pain details:    Quality:  Aching   Radiates to:  Does not radiate   Severity:  Moderate   Onset quality:  Gradual   Timing:  Constant   Progression:  Worsening Chronicity:  New Dislocation: no   Foreign body present:  No foreign bodies Associated symptoms: no back pain and no fatigue     Past Medical History:  Diagnosis Date  . Asthma   . Heart murmur   . Hypertension   . Panic attacks     There are no active problems to display for this patient.   History reviewed. No pertinent surgical history.   OB History    Gravida  0   Para  0   Term  0   Preterm  0   AB  0   Living  0     SAB  0   TAB  0   Ectopic  0   Multiple  0   Live Births  0            Home Medications    Prior to Admission medications   Medication Sig Start Date End Date Taking? Authorizing Provider  acetaminophen (TYLENOL) 325 MG tablet Take 650 mg by mouth every 6 (six) hours as needed for mild pain or headache.   Yes [provider]  PROAIR HFA 108 (90 Base) MCG/ACT inhaler 1-2 puffs every 4 (four) hours as needed for wheezing or shortness of breath.  02/01/18  Yes [provider]  amoxicillin-clavulanate (AUGMENTIN) 875-125 MG tablet Take 1 tablet by mouth 2 (two) times daily. One po bid x 7 days Patient not taking: Reported on 01/26/2016 02/03/15   Arthor Captain, PA-C  benzonatate (TESSALON) 100 MG capsule Take 1 capsule (100 mg total) by mouth every 8 (eight) hours. Patient  not taking: Reported on 03/21/2018 01/24/17   Demetrios Loll T, PA-C  celecoxib (CELEBREX) 100 MG capsule Take 1 every 12 hours as needed for discomfort 06/04/18   Bethann Berkshire, MD  fluticasone [facility]) 50 MCG/ACT nasal spray Place 2 sprays into both nostrils daily. Patient not taking: Reported on 03/21/2018 01/24/17   Demetrios Loll T, PA-C  guaiFENesin (ROBITUSSIN) 100 MG/5ML liquid Take 5-10 mLs (100-200 mg total) by mouth every 4 (four) hours as needed for congestion. Patient not taking: Reported on 01/26/2016 05/27/15   Fayrene Helper, PA-C  ibuprofen (ADVIL,MOTRIN) 600 MG tablet Take 1 tablet (600 mg total) by mouth every 6 (six) hours as needed. Patient not taking: Reported on 03/21/2018 10/01/17   Derwood Kaplan, MD  lidocaine (XYLOCAINE) 2 % solution Use as directed 15 mLs in the mouth or throat as needed for mouth pain. Patient not taking: Reported on 03/21/2018 07/13/17   Maczis, Elmer Sow, PA-C  naproxen (NAPROSYN) 375 MG tablet Take 1 tablet (375 mg total) by mouth 2 (two) times daily. Patient not taking: Reported on 03/21/2018 03/31/17   Fayrene Helper, PA-C  omeprazole (PRILOSEC) 20 MG capsule Take 1 capsule (20 mg total) by mouth daily. Patient not taking: Reported on 03/21/2018 01/24/17   Demetrios LollLeaphart, Kenneth T, PA-C  promethazine-dextromethorphan (PROMETHAZINE-DM) 6.25-15 MG/5ML syrup Take 5 mLs by mouth 4 (four) times daily as needed for cough. Patient not taking: Reported on 01/26/2016 05/27/15   Fayrene Helperran, [place], PA-C    Family History Family History  Problem Relation Age of Onset  . Hypertension Other   . Hypertension Mother   . Hypertension Father     Social History Social History   Tobacco Use  . Smoking status: Former Smoker    Types: Cigars  . Smokeless tobacco: Never Used  Substance Use Topics  . Alcohol use: Not Currently    Comment: Occasional  . Drug use: No     Allergies   Ibuprofen and Mushroom extract complex   Review of Systems Review of Systems    Constitutional: Negative for appetite change and fatigue.  HENT: Negative for congestion, ear discharge and sinus pressure.   Eyes: Negative for discharge.  Respiratory: Negative for cough.   Cardiovascular: Negative for chest pain.  Gastrointestinal: Negative for abdominal pain and diarrhea.  Genitourinary: Negative for frequency and hematuria.  Musculoskeletal: Negative for back pain.  Skin: Negative for rash.  Neurological: Negative for seizures and headaches.       Mild leg pain  Psychiatric/Behavioral: Negative for hallucinations.     Physical Exam Updated Vital Signs BP 122/84   Pulse 78   Temp 98.3 F (36.8 C) (Oral)   Resp 19   LMP 05/14/2018 (Approximate) Comment: -hcg 06/04/2018  SpO2 100%   Physical Exam Vitals signs and nursing note reviewed.  Constitutional:      Appearance: She is well-developed.  HENT:     Head: Normocephalic.     Nose: Nose normal.  Eyes:     General: No scleral icterus.    Conjunctiva/sclera: Conjunctivae normal.  Neck:     Musculoskeletal: Neck supple.     Thyroid: No thyromegaly.  Cardiovascular:     Rate and Rhythm: Normal rate and regular rhythm.     Heart sounds: No murmur. No friction rub. No gallop.   Pulmonary:     Breath sounds: No stridor. No wheezing or rales.  Chest:     Chest wall: No tenderness.  Abdominal:     General: There is no distension.     Tenderness: There is no abdominal tenderness. There is no rebound.  Musculoskeletal: Normal range of motion.     Comments: Tenderness to lumbar spine and both calves.  Patient full range of motion  Lymphadenopathy:     Cervical: No cervical adenopathy.  Skin:    Findings: No erythema or rash.  Neurological:     Mental Status: She is oriented to person, place, and time.     Motor: No abnormal muscle tone.     Coordination: Coordination normal.  Psychiatric:        Behavior: Behavior normal.      ED Treatments / Results  Labs (all labs ordered are listed, but  only abnormal results are displayed) Labs Reviewed  COMPREHENSIVE METABOLIC PANEL - Abnormal; Notable for the following components:      Result Value   Calcium 8.8 (*)    Total Bilirubin 0.2 (*)    All other components within normal limits  CBC WITH DIFFERENTIAL/PLATELET  CBG MONITORING, ED  I-STAT BETA HCG BLOOD, ED ([facility], [facility], [facility] ONLY)    EKG None  Radiology Dg  Lumbar Spine Complete  Result Date: 06/04/2018 CLINICAL DATA:  Low back pain with bilateral leg numbness EXAM: LUMBAR SPINE - COMPLETE 4+ VIEW COMPARISON:  None. FINDINGS: There is no evidence of lumbar spine fracture. Alignment is normal. Intervertebral disc spaces are maintained. IMPRESSION: Negative. Electronically Signed   By: Marnee Spring M.D.   On: 06/04/2018 09:06    Procedures Procedures (including critical care time)  Medications Ordered in ED Medications - No data to display   Initial Impression / Assessment and Plan / ED Course  I have reviewed the triage vital signs and the nursing notes.  Pertinent labs & imaging results that were available during my care of the patient were reviewed by me and considered in my medical decision making (see chart for details).     Patient with bilateral leg pain and lower back pain secondary to standing all night.  She is given Celebrex and will follow-up as needed  Final Clinical Impressions(s) / ED Diagnoses   Final diagnoses:  Bilateral leg pain    ED Discharge Orders         Ordered    celecoxib (CELEBREX) 100 MG capsule     06/04/18 0954           Bethann Berkshire, MD 06/05/18 1248

## 2018-06-04 NOTE — Discharge Instructions (Signed)
Rest for the next 2 days.  Follow-up with your primary care doctor if continued problems

## 2018-06-07 NOTE — ED Notes (Signed)
Patient needed note for work

## 2019-01-02 ENCOUNTER — Other Ambulatory Visit: Payer: Self-pay

## 2019-01-02 DIAGNOSIS — Z20822 Contact with and (suspected) exposure to covid-19: Secondary | ICD-10-CM

## 2019-01-03 LAB — NOVEL CORONAVIRUS, NAA: SARS-CoV-2, NAA: NOT DETECTED

## 2019-01-15 ENCOUNTER — Ambulatory Visit: Payer: Medicaid Other | Admitting: Cardiology

## 2020-01-15 ENCOUNTER — Other Ambulatory Visit: Payer: Self-pay | Admitting: Nurse Practitioner

## 2020-01-15 DIAGNOSIS — G43009 Migraine without aura, not intractable, without status migrainosus: Secondary | ICD-10-CM

## 2020-01-23 ENCOUNTER — Ambulatory Visit
Admission: RE | Admit: 2020-01-23 | Discharge: 2020-01-23 | Disposition: A | Discharge status: Home / Self Care | Payer: Medicaid Other | Source: Ambulatory Visit | Attending: Nurse Practitioner | Admitting: Nurse Practitioner

## 2020-01-23 DIAGNOSIS — G43009 Migraine without aura, not intractable, without status migrainosus: Secondary | ICD-10-CM

## 2020-07-27 ENCOUNTER — Ambulatory Visit (HOSPITAL_COMMUNITY)
Admission: EM | Admit: 2020-07-27 | Discharge: 2020-07-27 | Disposition: A | Discharge status: Home / Self Care | Payer: Medicaid Other | Attending: Emergency Medicine | Admitting: Emergency Medicine

## 2020-07-27 ENCOUNTER — Encounter (HOSPITAL_COMMUNITY): Payer: Self-pay

## 2020-07-27 ENCOUNTER — Other Ambulatory Visit: Payer: Self-pay

## 2020-07-27 DIAGNOSIS — T148XXA Other injury of unspecified body region, initial encounter: Secondary | ICD-10-CM

## 2020-07-27 MED ORDER — LIDOCAINE 5 % EX OINT: 1.0000 "application " | TOPICAL_OINTMENT | Freq: Three times a day (TID) | CUTANEOUS | 0 refills | Status: DC | PRN

## 2020-07-27 NOTE — ED Triage Notes (Signed)
Pt presents with a blister on back of left heel since yesterday.

## 2020-07-27 NOTE — Discharge Instructions (Addendum)
Can apply lidocaine gel three times a day as needed, can apply covering over area to protect it, bandages can be found in the first aid section of any pharmacy  Do not open blister, can put you at risk for infection   Wear thicker socks while wearing sneakers

## 2020-07-27 NOTE — ED Provider Notes (Signed)
[facility]    CSN: [id number] Arrival date & time: 07/27/20  1549      History   Chief Complaint Chief Complaint  Patient presents with  . Blister on Heel    HPI Mackenzie Schultz is a 25 y.o. female.   Patient presents with blister on left heel starting yesterday evening after wearing high top air force 1s. Shoe was rubbing heel, appropriate size of shoe worn. Area tender to touch. Denies drainage, fever, chills.   Past Medical History:  Diagnosis Date  . Asthma   . Heart murmur   . Hypertension   . Panic attacks     There are no problems to display for this patient.   History reviewed. No pertinent surgical history.  OB History    Gravida  0   Para  0   Term  0   Preterm  0   AB  0   Living  0     SAB  0   IAB  0   Ectopic  0   Multiple  0   Live Births  0            Home Medications    Prior to Admission medications   Medication Sig Start Date End Date Taking? Authorizing Provider  lidocaine (XYLOCAINE) 5 % ointment Apply 1 application topically 3 (three) times daily as needed for mild pain. 07/27/20  Yes Daoud Lobue, Elita Boone, NP  acetaminophen (TYLENOL) 325 MG tablet Take 650 mg by mouth every 6 (six) hours as needed for mild pain or headache.    [provider]  amoxicillin-clavulanate (AUGMENTIN) 875-125 MG tablet Take 1 tablet by mouth 2 (two) times daily. One po bid x 7 days Patient not taking: Reported on 01/26/2016 02/03/15   Arthor Captain, PA-C  benzonatate (TESSALON) 100 MG capsule Take 1 capsule (100 mg total) by mouth every 8 (eight) hours. Patient not taking: Reported on 03/21/2018 01/24/17   Demetrios Loll T, PA-C  celecoxib (CELEBREX) 100 MG capsule Take 1 every 12 hours as needed for discomfort 06/04/18   Bethann Berkshire, MD  fluticasone [facility]) 50 MCG/ACT nasal spray Place 2 sprays into both nostrils daily. Patient not taking: Reported on 03/21/2018 01/24/17   Demetrios Loll T, PA-C  guaiFENesin  (ROBITUSSIN) 100 MG/5ML liquid Take 5-10 mLs (100-200 mg total) by mouth every 4 (four) hours as needed for congestion. Patient not taking: Reported on 01/26/2016 05/27/15   Fayrene Helper, PA-C  ibuprofen (ADVIL,MOTRIN) 600 MG tablet Take 1 tablet (600 mg total) by mouth every 6 (six) hours as needed. Patient not taking: Reported on 03/21/2018 10/01/17   Derwood Kaplan, MD  lidocaine (XYLOCAINE) 2 % solution Use as directed 15 mLs in the mouth or throat as needed for mouth pain. Patient not taking: Reported on 03/21/2018 07/13/17   Maczis, Elmer Sow, PA-C  naproxen (NAPROSYN) 375 MG tablet Take 1 tablet (375 mg total) by mouth 2 (two) times daily. Patient not taking: Reported on 03/21/2018 03/31/17   Fayrene Helper, PA-C  omeprazole (PRILOSEC) 20 MG capsule Take 1 capsule (20 mg total) by mouth daily. Patient not taking: Reported on 03/21/2018 01/24/17   Rise Mu, PA-C  PROAIR HFA 108 [phone] Base) MCG/ACT inhaler 1-2 puffs every 4 (four) hours as needed for wheezing or shortness of breath.  02/01/18   [provider]  promethazine-dextromethorphan (PROMETHAZINE-DM) 6.25-15 MG/5ML syrup Take 5 mLs by mouth 4 (four) times daily as needed for cough. Patient not taking: Reported  on 01/26/2016 05/27/15   Fayrene Helper, PA-C    Family History Family History  Problem Relation Age of Onset  . Hypertension Other   . Hypertension Mother   . Hypertension Father     Social History Social History   Tobacco Use  . Smoking status: Former Smoker    Types: Cigars  . Smokeless tobacco: Never Used  Substance Use Topics  . Alcohol use: Not Currently    Comment: Occasional  . Drug use: No     Allergies   Ibuprofen and Mushroom extract complex   Review of Systems Review of Systems  Constitutional: Negative.   Respiratory: Negative.   Cardiovascular: Negative.   Gastrointestinal: Negative.   Skin: Positive for wound. Negative for color change, pallor and rash.  Neurological: Negative.       Physical Exam Triage Vital Signs ED Triage Vitals  Enc Vitals Group     BP 07/27/20 1651 130/79     Pulse Rate 07/27/20 1651 86     Resp 07/27/20 1651 20     Temp 07/27/20 1651 98.4 F (36.9 C)     Temp Source 07/27/20 1651 Oral     SpO2 07/27/20 1651 100 %     Weight --      Height --      Head Circumference --      Peak Flow --      Pain Score 07/27/20 1652 5     Pain Loc --      Pain Edu? --      Excl. in GC? --    No data found.  Updated Vital Signs BP 130/79 (BP Location: Left Arm)   Pulse 86   Temp 98.4 F (36.9 C) (Oral)   Resp 20   LMP 07/07/2020   SpO2 100%   Visual Acuity Right Eye Distance:   Left Eye Distance:   Bilateral Distance:    Right Eye Near:   Left Eye Near:    Bilateral Near:     Physical Exam Constitutional:      Appearance: Normal appearance. She is normal weight.  HENT:     Head: Normocephalic.  Eyes:     Extraocular Movements: Extraocular movements intact.  Pulmonary:     Effort: Pulmonary effort is normal.  Musculoskeletal:        General: Normal range of motion.  Skin:    General: Skin is warm and dry.          Comments: Closed blister present on medial heel of left foot   Neurological:     General: No focal deficit present.     Mental Status: She is alert and oriented to person, place, and time. Mental status is at baseline.  Psychiatric:        Mood and Affect: Mood normal.        Behavior: Behavior normal.        Thought Content: Thought content normal.        Judgment: Judgment normal.      UC Treatments / Results  Labs (all labs ordered are listed, but only abnormal results are displayed) Labs Reviewed - No data to display  EKG   Radiology No results found.  Procedures Procedures (including critical care time)  Medications Ordered in UC Medications - No data to display  Initial Impression / Assessment and Plan / UC Course  I have reviewed the triage vital signs and the nursing  notes.  Pertinent labs & imaging results that were  available during my care of the patient were reviewed by me and considered in my medical decision making (see chart for details).  Friction blister  1. Sprayed with lidocaine spray and covered with non adherent dressing in office 2. Lidocaine 5% gel tid as needed, can cover with dressing while wearing shoes 3. Advised wearing thicker socks and ensuring shoe sizes are appropriate  Final Clinical Impressions(s) / UC Diagnoses   Final diagnoses:  Friction blister     Discharge Instructions     Can apply lidocaine gel three times a day as needed, can apply covering over area to protect it, bandages can be found in the first aid section of any pharmacy  Do not open blister, can put you at risk for infection   Wear thicker socks while wearing sneakers      ED Prescriptions    Medication Sig Dispense Auth. Provider   lidocaine (XYLOCAINE) 5 % ointment Apply 1 application topically 3 (three) times daily as needed for mild pain. 35.44 g Valinda Hoar, NP     PDMP not reviewed this encounter.   Valinda Hoar, NP 07/27/20 1726

## 2020-09-12 ENCOUNTER — Other Ambulatory Visit: Payer: Self-pay

## 2020-09-12 ENCOUNTER — Emergency Department (HOSPITAL_COMMUNITY)
Admission: EM | Admit: 2020-09-12 | Discharge: 2020-09-12 | Disposition: A | Discharge status: Home / Self Care | Payer: Medicaid Other | Attending: Emergency Medicine | Admitting: Emergency Medicine

## 2020-09-12 DIAGNOSIS — R519 Headache, unspecified: Secondary | ICD-10-CM

## 2020-09-12 DIAGNOSIS — I10 Essential (primary) hypertension: Secondary | ICD-10-CM | POA: Insufficient documentation

## 2020-09-12 DIAGNOSIS — Z20822 Contact with and (suspected) exposure to covid-19: Secondary | ICD-10-CM | POA: Diagnosis not present

## 2020-09-12 DIAGNOSIS — J45909 Unspecified asthma, uncomplicated: Secondary | ICD-10-CM | POA: Insufficient documentation

## 2020-09-12 DIAGNOSIS — Z87891 Personal history of nicotine dependence: Secondary | ICD-10-CM | POA: Insufficient documentation

## 2020-09-12 LAB — RESP PANEL BY RT-PCR (FLU A&B, COVID) ARPGX2
Influenza A by PCR: NEGATIVE
Influenza B by PCR: NEGATIVE
SARS Coronavirus 2 by RT PCR: NEGATIVE

## 2020-09-12 MED ORDER — KETOROLAC TROMETHAMINE 30 MG/ML IJ SOLN
30.0000 mg | Freq: Once | INTRAMUSCULAR | Status: AC
  Administered 2020-09-12: 30 mg via INTRAMUSCULAR
  Filled 2020-09-12: qty 1

## 2020-09-12 MED ORDER — ACETAMINOPHEN 500 MG PO TABS
1000.0000 mg | ORAL_TABLET | Freq: Once | ORAL | Status: AC
  Administered 2020-09-12: 1000 mg via ORAL
  Filled 2020-09-12: qty 2

## 2020-09-12 MED ORDER — NAPROXEN 500 MG PO TABS: 500.0000 mg | ORAL_TABLET | Freq: Two times a day (BID) | ORAL | 0 refills | Status: AC

## 2020-09-12 NOTE — Discharge Instructions (Addendum)
You may alternate taking Tylenol and Naproxen as needed for pain control. You may take Naproxen twice daily as directed on your discharge paperwork and you may take  [phone] mg of Tylenol every 6 hours. Do not exceed 4000 mg of Tylenol daily as this can lead to liver damage. Also, make sure to take Naproxen with meals as it can cause an upset stomach. Do not take other NSAIDs while taking Naproxen such as (Aleve, Ibuprofen, Aspirin, Celebrex, etc) and do not take more than the prescribed dose as this can lead to ulcers and bleeding in your GI tract. You may use warm and cold compresses to help with your symptoms.   I am concerned you may have a viral illness with the borderline fever that you have today in the emergency room. Please get plenty of rest, stay well hydrated monitor your symptoms closely.   Please follow up with your primary doctor within the next 7-10 days for re-evaluation and further treatment of your symptoms.   Please return to the ER sooner if you have any new or worsening symptoms.

## 2020-09-12 NOTE — ED Triage Notes (Signed)
Patient reports headache pain rated 9/10. Patient says she has been having these headaches since her mama had her.

## 2020-09-12 NOTE — ED Provider Notes (Signed)
Emergency Medicine Provider Triage Evaluation Note  Mackenzie Schultz , a 25 y.o. female  was evaluated in triage.  Pt complains of headache. She states she has been having headaches since she was born but today her headache was worse than normal. Has been taking otc meds without relief. Denies numbness/weakness.  Review of Systems  Positive: headache Negative: Numbness, weakness  Physical Exam  BP (!) 147/98 (BP Location: Left Arm)   Pulse 81   Temp 98.6 F (37 C) (Oral)   Resp 18   SpO2 100%  Gen:   Awake, no distress   Resp:  Normal effort  MSK:   Moves extremities without difficulty  Other:  No focal neuro deficits  Medical Decision Making  Medically screening exam initiated at 1:24 PM.  Appropriate orders placed.  Rudolph F Privott was informed that the remainder of the evaluation will be completed by another provider, this initial triage assessment does not replace that evaluation, and the importance of remaining in the ED until their evaluation is complete.     Rayne Du 09/12/20 1327    Pricilla Loveless, MD 09/12/20 1459

## 2020-09-12 NOTE — ED Provider Notes (Signed)
[facility]-EMERGENCY DEPT Provider Note   CSN: [id number] Arrival date & time: 09/12/20  1257     History Chief Complaint  Patient presents with  . Headache    Mackenzie Schultz is a 25 y.o. female.  HPI   pt is a 25 y/o female with a h/o asthma, heart murmur, htn, panic attacks who presents to the ed today for eval for a headache. States she has had headaches for her entire life however over the last few days her headache has been worse. States that the headache moves around and is located in different places in her head at different times. Describes the pain as sharp. Rates pain 10/10. Reports photophobia, numbness, weakness, lightheadedness.  Denies nausea, vomiting.  Past Medical History:  Diagnosis Date  . Asthma   . Heart murmur   . Hypertension   . Panic attacks     There are no problems to display for this patient.   No past surgical history on file.   OB History    Gravida  0   Para  0   Term  0   Preterm  0   AB  0   Living  0     SAB  0   IAB  0   Ectopic  0   Multiple  0   Live Births  0           Family History  Problem Relation Age of Onset  . Hypertension Other   . Hypertension Mother   . Hypertension Father     Social History   Tobacco Use  . Smoking status: Former Smoker    Types: Cigars  . Smokeless tobacco: Never Used  Substance Use Topics  . Alcohol use: Not Currently    Comment: Occasional  . Drug use: No    Home Medications Prior to Admission medications   Medication Sig Start Date End Date Taking? Authorizing Provider  naproxen (NAPROSYN) 500 MG tablet Take 1 tablet (500 mg total) by mouth 2 (two) times daily for 7 days. 09/12/20 09/19/20 Yes Quint Chestnut S, PA-C  acetaminophen (TYLENOL) 325 MG tablet Take 650 mg by mouth every 6 (six) hours as needed for mild pain or headache.    [provider]  amoxicillin-clavulanate (AUGMENTIN) 875-125 MG tablet Take 1 tablet by mouth 2  (two) times daily. One po bid x 7 days Patient not taking: Reported on 01/26/2016 02/03/15   Arthor Captain, PA-C  benzonatate (TESSALON) 100 MG capsule Take 1 capsule (100 mg total) by mouth every 8 (eight) hours. Patient not taking: Reported on 03/21/2018 01/24/17   Demetrios Loll T, PA-C  celecoxib (CELEBREX) 100 MG capsule Take 1 every 12 hours as needed for discomfort 06/04/18   Bethann Berkshire, MD  fluticasone [facility]) 50 MCG/ACT nasal spray Place 2 sprays into both nostrils daily. Patient not taking: Reported on 03/21/2018 01/24/17   Demetrios Loll T, PA-C  guaiFENesin (ROBITUSSIN) 100 MG/5ML liquid Take 5-10 mLs (100-200 mg total) by mouth every 4 (four) hours as needed for congestion. Patient not taking: Reported on 01/26/2016 05/27/15   Fayrene Helper, PA-C  ibuprofen (ADVIL,MOTRIN) 600 MG tablet Take 1 tablet (600 mg total) by mouth every 6 (six) hours as needed. Patient not taking: Reported on 03/21/2018 10/01/17   Derwood Kaplan, MD  lidocaine (XYLOCAINE) 2 % solution Use as directed 15 mLs in the mouth or throat as needed for mouth pain. Patient not taking: Reported on 03/21/2018 07/13/17  Maczis, Elmer Sow, PA-C  lidocaine (XYLOCAINE) 5 % ointment Apply 1 application topically 3 (three) times daily as needed for mild pain. 07/27/20   White, Elita Boone, NP  omeprazole (PRILOSEC) 20 MG capsule Take 1 capsule (20 mg total) by mouth daily. Patient not taking: Reported on 03/21/2018 01/24/17   Rise Mu, PA-C  PROAIR HFA 108 [phone] Base) MCG/ACT inhaler 1-2 puffs every 4 (four) hours as needed for wheezing or shortness of breath.  02/01/18   [provider]  promethazine-dextromethorphan (PROMETHAZINE-DM) 6.25-15 MG/5ML syrup Take 5 mLs by mouth 4 (four) times daily as needed for cough. Patient not taking: Reported on 01/26/2016 05/27/15   Fayrene Helper, PA-C    Allergies    Ibuprofen and Mushroom extract complex  Review of Systems   Review of Systems  Constitutional: Negative  for fever.  HENT: Negative for ear pain and sore throat.   Eyes: Positive for photophobia. Negative for visual disturbance.  Respiratory: Negative for cough and shortness of breath.   Cardiovascular: Negative for chest pain.  Gastrointestinal: Negative for abdominal pain, constipation, diarrhea, nausea and vomiting.  Genitourinary: Negative for dysuria and hematuria.  Musculoskeletal: Negative for back pain.  Skin: Negative for rash.  Neurological: Positive for light-headedness and headaches.  All other systems reviewed and are negative.   Physical Exam Updated Vital Signs BP (!) 138/95   Pulse 80   Temp 100 F (37.8 C)   Resp 17   Ht 5\' 4"  (1.626 m)   SpO2 99%   BMI 27.34 kg/m   Physical Exam Vitals and nursing note reviewed.  Constitutional:      General: She is not in acute distress.    Appearance: She is well-developed.  HENT:     Head: Normocephalic and atraumatic.  Eyes:     Conjunctiva/sclera: Conjunctivae normal.  Cardiovascular:     Rate and Rhythm: Normal rate and regular rhythm.  Pulmonary:     Effort: Pulmonary effort is normal.     Breath sounds: Normal breath sounds.  Abdominal:     Palpations: Abdomen is soft.     Tenderness: There is no abdominal tenderness.  Musculoskeletal:        General: Normal range of motion.     Cervical back: Neck supple.  Skin:    General: Skin is warm and dry.  Neurological:     Mental Status: She is alert.     Comments: Mental Status:  Alert, thought content appropriate, able to give a coherent history. Speech fluent without evidence of aphasia. Able to follow 2 step commands without difficulty.  Cranial Nerves:  II:  pupils equal, round, reactive to light III,IV, VI: ptosis not present, extra-ocular motions intact bilaterally  V,VII: smile symmetric, facial light touch sensation equal VIII: hearing grossly normal to voice  X: uvula elevates symmetrically  XI: bilateral shoulder shrug symmetric and strong XII:  midline tongue extension without fassiculations Motor:  Normal tone. 5/5 strength of BUE and BLE major muscle groups including strong and equal grip strength and dorsiflexion/plantar flexion Sensory: light touch normal in all extremities. Gait: normal gait and balance.       ED Results / Procedures / Treatments   Labs (all labs ordered are listed, but only abnormal results are displayed) Labs Reviewed  RESP PANEL BY RT-PCR (FLU A&B, COVID) [id number]    EKG None  Radiology No results found.  Procedures Procedures   Medications Ordered in ED Medications  acetaminophen (TYLENOL) tablet 1,000 mg (1,000 mg Oral  Given 09/12/20 2008)  ketorolac (TORADOL) 30 MG/ML injection 30 mg (30 mg Intramuscular Given 09/12/20 2008)    ED Course  I have reviewed the triage vital signs and the nursing notes.  Pertinent labs & imaging results that were available during my care of the patient were reviewed by me and considered in my medical decision making (see chart for details).    MDM Rules/Calculators/A&P                          25 year old female presents the emergency department today for evaluation of a headache.  She states she has a long history of migraines this feels typical of her headaches.  She also has a borderline fever here in the ED.  She has no real infectious symptoms and her COVID and flu tests are negative.  I suspect she may have another viral infection that may be exacerbating her headaches.  She has a nonfocal neurologic exam and I doubt any emergent neurologic cause of symptoms.  She is feeling better after Tylenol and Toradol here in the ED.  She used to be on medication for her migraines however she no longer has a prescription so I advised that she make an appointment with her PCP to talk about ongoing prescriptions for her headaches.  She is agreeable to this.  I have advised that she return to the emergency department for any new or worsening symptoms in the meantime.  She  voices understanding of the plan and reasons to return.  All questions answered.  Patient stable for discharge.  Final Clinical Impression(s) / ED Diagnoses Final diagnoses:  Nonintractable headache, unspecified chronicity pattern, unspecified headache type    Rx / DC Orders ED Discharge Orders         Ordered    naproxen (NAPROSYN) 500 MG tablet  2 times daily        09/12/20 2114           Rayne Du 09/12/20 2116    Linwood Dibbles, MD 09/12/20 2153

## 2020-10-28 ENCOUNTER — Emergency Department (HOSPITAL_COMMUNITY)
Admission: EM | Admit: 2020-10-28 | Discharge: 2020-10-28 | Disposition: A | Discharge status: Home / Self Care | Payer: Medicaid Other | Attending: Emergency Medicine | Admitting: Emergency Medicine

## 2020-10-28 ENCOUNTER — Encounter (HOSPITAL_COMMUNITY): Payer: Self-pay | Admitting: *Deleted

## 2020-10-28 ENCOUNTER — Other Ambulatory Visit: Payer: Self-pay

## 2020-10-28 ENCOUNTER — Emergency Department (HOSPITAL_COMMUNITY): Payer: Medicaid Other

## 2020-10-28 DIAGNOSIS — B9689 Other specified bacterial agents as the cause of diseases classified elsewhere: Secondary | ICD-10-CM | POA: Diagnosis not present

## 2020-10-28 DIAGNOSIS — I10 Essential (primary) hypertension: Secondary | ICD-10-CM | POA: Insufficient documentation

## 2020-10-28 DIAGNOSIS — Z87891 Personal history of nicotine dependence: Secondary | ICD-10-CM | POA: Diagnosis not present

## 2020-10-28 DIAGNOSIS — N12 Tubulo-interstitial nephritis, not specified as acute or chronic: Secondary | ICD-10-CM

## 2020-10-28 DIAGNOSIS — J45909 Unspecified asthma, uncomplicated: Secondary | ICD-10-CM | POA: Insufficient documentation

## 2020-10-28 DIAGNOSIS — R1030 Lower abdominal pain, unspecified: Secondary | ICD-10-CM | POA: Insufficient documentation

## 2020-10-28 DIAGNOSIS — R112 Nausea with vomiting, unspecified: Secondary | ICD-10-CM | POA: Diagnosis present

## 2020-10-28 LAB — COMPREHENSIVE METABOLIC PANEL
ALT: 15 U/L (ref 0–44)
AST: 20 U/L (ref 15–41)
Albumin: 4.3 g/dL (ref 3.5–5.0)
Alkaline Phosphatase: 60 U/L (ref 38–126)
Anion gap: 8 (ref 5–15)
BUN: 15 mg/dL (ref 6–20)
CO2: 24 mmol/L (ref 22–32)
Calcium: 9.3 mg/dL (ref 8.9–10.3)
Chloride: 104 mmol/L (ref 98–111)
Creatinine, Ser: 0.98 mg/dL (ref 0.44–1.00)
GFR, Estimated: >60 mL/min (ref >60)
Glucose, Bld: 103 mg/dL — ABNORMAL HIGH (ref 70–99)
Potassium: 3.8 mmol/L (ref 3.5–5.1)
Sodium: 136 mmol/L (ref 135–145)
Total Bilirubin: 0.7 mg/dL (ref 0.3–1.2)
Total Protein: 7.7 g/dL (ref 6.5–8.1)

## 2020-10-28 LAB — CBC
HCT: 42.3 % (ref 36.0–46.0)
Hemoglobin: 13.9 g/dL (ref 12.0–15.0)
MCH: 29.4 pg (ref 26.0–34.0)
MCHC: 32.9 g/dL (ref 30.0–36.0)
MCV: 89.6 fL (ref 80.0–100.0)
Platelets: 290 10*3/uL (ref 150–400)
RBC: 4.72 MIL/uL (ref 3.87–5.11)
RDW: 12.2 % (ref 11.5–15.5)
WBC: 13.2 10*3/uL — ABNORMAL HIGH (ref 4.0–10.5)
nRBC: 0 % (ref 0.0–0.2)

## 2020-10-28 LAB — LIPASE, BLOOD: Lipase: 30 U/L (ref 11–51)

## 2020-10-28 LAB — I-STAT BETA HCG BLOOD, ED (MC, WL, AP ONLY): I-stat hCG, quantitative: <5 m[IU]/mL (ref <5)

## 2020-10-28 LAB — URINALYSIS, ROUTINE W REFLEX MICROSCOPIC
Bilirubin Urine: NEGATIVE
Glucose, UA: NEGATIVE mg/dL
Ketones, ur: NEGATIVE mg/dL
Nitrite: NEGATIVE
Protein, ur: 100 mg/dL — AB
RBC / HPF: >50 RBC/hpf — ABNORMAL HIGH (ref 0–5)
Specific Gravity, Urine: 1.014 (ref 1.005–1.030)
WBC, UA: >50 WBC/hpf — ABNORMAL HIGH (ref 0–5)
pH: 5 (ref 5.0–8.0)

## 2020-10-28 MED ORDER — SODIUM CHLORIDE 0.9 % IV SOLN
1.0000 g | Freq: Once | INTRAVENOUS | Status: AC
  Administered 2020-10-28: 1 g via INTRAVENOUS
  Filled 2020-10-28: qty 10

## 2020-10-28 MED ORDER — ONDANSETRON 4 MG PO TBDP: 4.0000 mg | ORAL_TABLET | Freq: Three times a day (TID) | ORAL | 0 refills | Status: DC | PRN

## 2020-10-28 MED ORDER — MORPHINE SULFATE (PF) 4 MG/ML IV SOLN
4.0000 mg | Freq: Once | INTRAVENOUS | Status: AC
  Administered 2020-10-28: 4 mg via INTRAVENOUS
  Filled 2020-10-28: qty 1

## 2020-10-28 MED ORDER — SODIUM CHLORIDE 0.9 % IV BOLUS
1000.0000 mL | Freq: Once | INTRAVENOUS | Status: AC
  Administered 2020-10-28: 1000 mL via INTRAVENOUS

## 2020-10-28 MED ORDER — CEPHALEXIN 500 MG PO CAPS: 500.0000 mg | ORAL_CAPSULE | Freq: Three times a day (TID) | ORAL | 0 refills | Status: AC

## 2020-10-28 MED ORDER — ONDANSETRON 4 MG PO TBDP
4.0000 mg | ORAL_TABLET | Freq: Once | ORAL | Status: AC | PRN
  Administered 2020-10-28: 4 mg via ORAL
  Filled 2020-10-28: qty 1

## 2020-10-28 MED ORDER — ONDANSETRON HCL 4 MG/2ML IJ SOLN
4.0000 mg | Freq: Once | INTRAMUSCULAR | Status: AC
  Administered 2020-10-28: 4 mg via INTRAVENOUS
  Filled 2020-10-28: qty 2

## 2020-10-28 NOTE — ED Triage Notes (Signed)
Pt complains of right flank and emesis x 1 week. Symptoms have been worsening. No dysuria/blood in urine. No diarrhea.

## 2020-10-28 NOTE — Discharge Instructions (Signed)
You are seen in the ER today for your nausea, vomiting, and back pain as well as your urinary symptoms.  You are diagnosed with pyelonephritis, which is a urinary tract infection that progresses up towards the kidney.  You administered your first dose of antibiotics in the emergency department, and have been prescribed antibiotics to take at home for the next 10 days.  Please take them as prescribed for the entire course.  You have also been prescribed and as needed nausea medication.  May alternate Tylenol and naproxen at home for your discomfort, and you should follow-up with your primary care doctor.  Return to the ER develop any worsening abdominal pain or back pain, blood in your urine, nausea or vomiting does not stop, fevers >100.4 degrees Fahrenheit despite antibiotics for 48 hours, or any other new severe symptoms.

## 2020-10-28 NOTE — ED Provider Notes (Signed)
[facility]-EMERGENCY DEPT Provider Note   CSN: [id number] Arrival date & time: 10/28/20  1348   History Chief Complaint  Patient presents with   Flank Pain   Emesis   Mackenzie Schultz is a 25 y.o. female who presents with 1 week of urinary frequency, urgency, and lower abdominal pressure with urination who presents today with concern progressions of her symptoms to involve right flank pain and associated nausea and vomiting with NBNB emesis.  States that her pain progressed today which prompted her to come to the emergency department.  States she has not had urinary tract infection in a long time.  Denies any sexual activity at this time.  Denies fevers and chills.  LMP 09/30/20.  I personally reviewed this patient's medical records.  She is history of heart murmur hypertension, and asthma.  Has history of allergy to ibuprofen but takes naproxen regularly for headaches. HPI     Past Medical History:  Diagnosis Date   Asthma    Heart murmur    Hypertension    Panic attacks     There are no problems to display for this patient.   History reviewed. No pertinent surgical history.   OB History     Gravida  0   Para  0   Term  0   Preterm  0   AB  0   Living  0      SAB  0   IAB  0   Ectopic  0   Multiple  0   Live Births  0           Family History  Problem Relation Age of Onset   Hypertension Other    Hypertension Mother    Hypertension Father     Social History   Tobacco Use   Smoking status: Former    Pack years: 0.00    Types: Cigars   Smokeless tobacco: Never  Substance Use Topics   Alcohol use: Not Currently    Comment: Occasional   Drug use: No    Home Medications Prior to Admission medications   Medication Sig Start Date End Date Taking? Authorizing Provider  cephALEXin (KEFLEX) 500 MG capsule Take 1 capsule (500 mg total) by mouth 3 (three) times daily for 10 days. 10/28/20 11/07/20 Yes Laneah Luft, Lupe Carney  R, PA-C  ondansetron (ZOFRAN ODT) 4 MG disintegrating tablet Take 1 tablet (4 mg total) by mouth every 8 (eight) hours as needed for nausea or vomiting. 10/28/20  Yes Ola Fawver R, PA-C  acetaminophen (TYLENOL) 325 MG tablet Take 650 mg by mouth every 6 (six) hours as needed for mild pain or headache.    [provider]  benzonatate (TESSALON) 100 MG capsule Take 1 capsule (100 mg total) by mouth every 8 (eight) hours. Patient not taking: Reported on 03/21/2018 01/24/17   Demetrios Loll T, PA-C  celecoxib (CELEBREX) 100 MG capsule Take 1 every 12 hours as needed for discomfort 06/04/18   Bethann Berkshire, MD  fluticasone [facility]) 50 MCG/ACT nasal spray Place 2 sprays into both nostrils daily. Patient not taking: Reported on 03/21/2018 01/24/17   Demetrios Loll T, PA-C  guaiFENesin (ROBITUSSIN) 100 MG/5ML liquid Take 5-10 mLs (100-200 mg total) by mouth every 4 (four) hours as needed for congestion. Patient not taking: Reported on 01/26/2016 05/27/15   Fayrene Helper, PA-C  ibuprofen (ADVIL,MOTRIN) 600 MG tablet Take 1 tablet (600 mg total) by mouth every 6 (six) hours as needed. Patient not taking:  Reported on 03/21/2018 10/01/17   Derwood Kaplan, MD  lidocaine (XYLOCAINE) 2 % solution Use as directed 15 mLs in the mouth or throat as needed for mouth pain. Patient not taking: Reported on 03/21/2018 07/13/17   Maczis, Elmer Sow, PA-C  lidocaine (XYLOCAINE) 5 % ointment Apply 1 application topically 3 (three) times daily as needed for mild pain. 07/27/20   White, Elita Boone, NP  omeprazole (PRILOSEC) 20 MG capsule Take 1 capsule (20 mg total) by mouth daily. Patient not taking: Reported on 03/21/2018 01/24/17   Rise Mu, PA-C  PROAIR HFA 108 [phone] Base) MCG/ACT inhaler 1-2 puffs every 4 (four) hours as needed for wheezing or shortness of breath.  02/01/18   [provider]  promethazine-dextromethorphan (PROMETHAZINE-DM) 6.25-15 MG/5ML syrup Take 5 mLs by mouth 4 (four) times  daily as needed for cough. Patient not taking: Reported on 01/26/2016 05/27/15   Fayrene Helper, PA-C    Allergies    Ibuprofen and Mushroom extract complex  Review of Systems   Review of Systems  Constitutional:  Positive for activity change and appetite change. Negative for chills, diaphoresis, fatigue and fever.  HENT: Negative.    Respiratory: Negative.    Cardiovascular: Negative.   Gastrointestinal:  Positive for abdominal pain, nausea and vomiting. Negative for blood in stool, constipation and diarrhea.  Genitourinary:  Positive for flank pain, frequency and urgency. Negative for decreased urine volume, difficulty urinating, dyspareunia, hematuria, vaginal bleeding, vaginal discharge and vaginal pain.   Physical Exam Updated Vital Signs BP 107/83 (BP Location: Right Arm)   Pulse 87   Temp 98.3 F (36.8 C) (Oral)   Resp 18   LMP 09/30/2020 Comment: negative HCG blood test 10-28-2020  SpO2 100%   Physical Exam Vitals and nursing note reviewed.  Constitutional:      Appearance: She is overweight. She is not toxic-appearing.  HENT:     Head: Normocephalic and atraumatic.     Mouth/Throat:     Mouth: Mucous membranes are moist.     Pharynx: Oropharynx is clear. Uvula midline. No oropharyngeal exudate, posterior oropharyngeal erythema or uvula swelling.     Tonsils: No tonsillar exudate.  Eyes:     General: Lids are normal. Vision grossly intact.        Right eye: No discharge.        Left eye: No discharge.     Extraocular Movements: Extraocular movements intact.     Conjunctiva/sclera: Conjunctivae normal.     Pupils: Pupils are equal, round, and reactive to light.  Neck:     Trachea: Trachea and phonation normal.  Cardiovascular:     Rate and Rhythm: Normal rate and regular rhythm.     Pulses: Normal pulses.     Heart sounds: Normal heart sounds. No murmur heard. Pulmonary:     Effort: Pulmonary effort is normal. No tachypnea, bradypnea, accessory muscle usage,  prolonged expiration or respiratory distress.     Breath sounds: Normal breath sounds. No wheezing or rales.  Chest:     Chest wall: No mass, lacerations, deformity, swelling, tenderness, crepitus or edema.  Abdominal:     General: Bowel sounds are normal. There is no distension.     Palpations: Abdomen is soft.     Tenderness: There is generalized abdominal tenderness and tenderness in the right lower quadrant and suprapubic area. There is right CVA tenderness. There is no left CVA tenderness, guarding or rebound. Negative signs include Murphy's sign and Rovsing's sign.  Musculoskeletal:  General: No deformity.     Cervical back: Normal range of motion and neck supple. No edema, rigidity or crepitus. No pain with movement, spinous process tenderness or muscular tenderness.     Right lower leg: No edema.     Left lower leg: No edema.  Lymphadenopathy:     Cervical: No cervical adenopathy.  Skin:    General: Skin is warm and dry.     Capillary Refill: Capillary refill takes less than 2 seconds.  Neurological:     General: No focal deficit present.     Mental Status: She is alert and oriented to person, place, and time. Mental status is at baseline.  Psychiatric:        Mood and Affect: Mood normal.    ED Results / Procedures / Treatments   Labs (all labs ordered are listed, but only abnormal results are displayed) Labs Reviewed  URINALYSIS, ROUTINE W REFLEX MICROSCOPIC - Abnormal; Notable for the following components:      Result Value   APPearance CLOUDY (*)    Hgb urine dipstick MODERATE (*)    Protein, ur 100 (*)    Leukocytes,Ua LARGE (*)    RBC / HPF >50 (*)    WBC, UA >50 (*)    Bacteria, UA MANY (*)    Non Squamous Epithelial 0-5 (*)    All other components within normal limits  COMPREHENSIVE METABOLIC PANEL - Abnormal; Notable for the following components:   Glucose, Bld 103 (*)    All other components within normal limits  CBC - Abnormal; Notable for the  following components:   WBC 13.2 (*)    All other components within normal limits  URINE CULTURE  LIPASE, BLOOD  I-STAT BETA HCG BLOOD, ED ([facility], [facility], [facility] ONLY)    EKG None  Radiology CT Renal Stone Study  Result Date: 10/28/2020 CLINICAL DATA:  Acute right flank pain. No history of kidney stones. EXAM: CT ABDOMEN AND PELVIS WITHOUT CONTRAST TECHNIQUE: Multidetector CT imaging of the abdomen and pelvis was performed following the standard protocol without IV contrast. COMPARISON:  None. FINDINGS: Lower chest: No acute abnormality. Hepatobiliary: No focal liver abnormality is seen. No gallstones, gallbladder wall thickening, or biliary dilatation. Pancreas: Unremarkable. No pancreatic ductal dilatation or surrounding inflammatory changes. Spleen: Normal in size without focal abnormality. Adrenals/Urinary Tract: Adrenal glands are unremarkable. Subtle increased density in the left renal medullary pyramids. No renal calculi, focal lesion, or hydronephrosis. Bladder is under distended. Stomach/Bowel: Stomach is within normal limits. Appendix appears normal. No evidence of bowel wall thickening, distention, or inflammatory changes. Vascular/Lymphatic: No significant vascular findings are present. No enlarged abdominal or pelvic lymph nodes. Reproductive: Uterus and bilateral adnexa are unremarkable. Other: Small free fluid in the pelvis is likely physiologic. No pneumoperitoneum. Musculoskeletal: No acute or significant osseous findings. IMPRESSION: 1. No acute intra-abdominal process. No obstructive uropathy. 2. 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 Electronically Signed   By: Obie DredgeWilliam T Derry M.D.   On: 10/28/2020 16:01    Procedures Procedures   Medications Ordered in ED Medications  ondansetron (ZOFRAN-ODT) disintegrating tablet 4 mg (4 mg Oral Given 10/28/20 1409)  morphine 4 MG/ML injection 4 mg (4 mg Intravenous Given 10/28/20  1759)  ondansetron (ZOFRAN) injection 4 mg (4 mg Intravenous Given 10/28/20 1759)  sodium chloride 0.9 % bolus 1,000 mL (0 mLs Intravenous Stopped 10/28/20 1928)  cefTRIAXone (ROCEPHIN) 1 g in sodium chloride 0.9 % 100 mL IVPB (0  g Intravenous Stopped 10/28/20 1840)    ED Course  I have reviewed the triage vital signs and the nursing notes.  Pertinent labs & imaging results that were available during my care of the patient were reviewed by me and considered in my medical decision making (see chart for details).  Clinical Course as of 10/28/20 1952  Wed Oct 28, 2020  1841 This is a 25 year old female presenting to emergency department with abdominal discomfort, dysuria, urinary frequency, right flank pain, intermittently for 1 week.  She also reports of nausea and vomiting earlier today.  Her work-up was consistent with a urinary tract infection.  She had large leukocytes and bacteria and blood in her urine.  Renal stone study did not show any obvious kidney stone, but there was some stranding or questionable for amatory findings around the left kidney.  She has a fairly benign abdominal exam aside from suprapubic tenderness.  She was given IV Rocephin, and I feel with this work-up would be reasonable to treat her with 10 days of antibiotics for pyelonephritis.  She was also given Zofran.  She is very comfortable my exam, improved nausea, and ready to go home.  Return precautions were discussed. [MT]    Clinical Course User Index [MT] Trifan, Kermit Balo, MD   MDM Rules/Calculators/A&P                         25 year old female presents with 1 week of urinary frequency/urgency/dysuria, now with right flank pain, nausea, vomiting.  Differential diagnosis includes not limited to pyelonephritis, psoas/ureterolithiasis, acute cystitis, AAA mesenteric ischemia, cholelithiasis/cholecystitis, pancreatitis, appendicitis, PID/TOA, ectopic pregnancy.  Intake.  Cardiopulmonary exam is normal, abdominal exam  with suprapubic tenderness palpation as well as mild right lower and upper quadrant tenderness without Murphy's or McBurney's point tenderness.  Right CVAT without left-sided tenderness to palpation.  Patient is neurovascularly intact in all 4 extremities.  CBC with leukocytosis of 13,000, CMP unremarkable with normal kidney function.  Patient is not pregnant, has normal lipase.  UA concerning for infection with moderate hemoglobin, proteinuria, large leukocytes, and many bacteria.     CT renal study ordered in triage which was negative for nephrolithiasis, no acute intra-abdominal process and no obstructive uropathy.  Very subtle increased density in the left renal medullary pyramids which could reflect early medullary nephrocalcinosis.  Physical exam and HPI most consistent with pyelonephritis.  Will administer dose of Rocephin IV through the emergency department; will disposition home if patient is able to tolerate p.o. and her nausea and pain are under control.  Patient reevaluated after analgesia and antiemetic, tolerating p.o. well and feeling improved from her arrival to the emergency department.  Will discharge with course of cephalexin at home as well as as needed Zofran.  Recommend close PCP follow-up.  No further work-up is warranted in ED as time.  Emberley voiced understanding of her medical evaluation and treatment plan.  Each of her questions was answered to her expressed satisfaction.  Return precautions given.  Patient is stable and appropriate for discharge at this time.  This chart was dictated using voice recognition software, Dragon. Despite the best efforts of this provider to proofread and correct errors, errors may still occur which can change documentation meaning.  Final Clinical Impression(s) / ED Diagnoses Final diagnoses:  Pyelonephritis    Rx / DC Orders ED Discharge Orders          Ordered    cephALEXin (KEFLEX) 500 MG capsule  3 times daily        10/28/20  1910    ondansetron (ZOFRAN ODT) 4 MG disintegrating tablet  Every 8 hours PRN        10/28/20 1910             Robyn Galati, Idelia Salm 10/28/20 1952    Terald Sleeper, MD 10/28/20 2208

## 2020-10-28 NOTE — ED Provider Notes (Addendum)
Emergency Medicine Provider Triage Evaluation Note  Mackenzie Schultz , a 25 y.o. female  was evaluated in triage.  Pt complains of sudden onset, intermittent, sharp, right flank pain for the past week with nausea and NBNB emesis. She states she has been dealing with the pain for the past week however today it was much worse prompting ED visit. She is also having urinary frequency. No dysuria. No fevers or chills. No diarrhea. No hx of kidney stones in the past. No previous surgeries to abdomen. LNMP 06/15.   Review of Systems  Positive: + R flank pain, nausea, vomiting, urinary frequency Negative: - fevers, chills,   Physical Exam  BP 130/81 (BP Location: Left Arm)   Pulse 85   Temp 98.2 F (36.8 C) (Oral)   Resp 18   LMP 09/30/2020   SpO2 100%  Gen:   Awake, no distress   Resp:  Normal effort  MSK:   Moves extremities without difficulty  Other:  + R CVA TTP  Medical Decision Making  Medically screening exam initiated at 2:06 PM.  Appropriate orders placed.  Mackenzie Schultz was informed that the remainder of the evaluation will be completed by another provider, this initial triage assessment does not replace that evaluation, and the importance of remaining in the ED until their evaluation is complete.     Tanda Rockers, PA-C 10/28/20 1409    Tanda Rockers, PA-C 10/28/20 1409    Tegeler, Canary Brim, MD 10/28/20 [phone]

## 2020-10-30 LAB — URINE CULTURE

## 2020-12-01 ENCOUNTER — Other Ambulatory Visit: Payer: Self-pay

## 2020-12-01 ENCOUNTER — Ambulatory Visit (INDEPENDENT_AMBULATORY_CARE_PROVIDER_SITE_OTHER): Payer: Medicaid Other

## 2020-12-01 VITALS — BP 132/84 | HR 70

## 2020-12-01 DIAGNOSIS — Z3201 Encounter for pregnancy test, result positive: Secondary | ICD-10-CM

## 2020-12-01 DIAGNOSIS — Z3200 Encounter for pregnancy test, result unknown: Secondary | ICD-10-CM

## 2020-12-01 LAB — POCT URINE PREGNANCY: Preg Test, Ur: POSITIVE — AB

## 2020-12-01 MED ORDER — PREPLUS 27-1 MG PO TABS: 1.0000 | ORAL_TABLET | Freq: Every day | ORAL | 13 refills | Status: DC

## 2020-12-01 NOTE — Progress Notes (Signed)
Mackenzie Schultz presents today for UPT. She has no unusual complaints. LMP:10/12/2020    OBJECTIVE: Appears well, in no apparent distress.  OB History     Gravida  0   Para  0   Term  0   Preterm  0   AB  0   Living  0      SAB  0   IAB  0   Ectopic  0   Multiple  0   Live Births  0          Home UPT Result: POS In-Office UPT result:POS I have reviewed the patient's medical, obstetrical, social, and family histories, and medications.   ASSESSMENT: Positive pregnancy test  PLAN Prenatal care to be completed at: Will start care at [facility] around 10 weeks.

## 2020-12-23 ENCOUNTER — Ambulatory Visit (INDEPENDENT_AMBULATORY_CARE_PROVIDER_SITE_OTHER): Payer: Medicaid Other

## 2020-12-23 ENCOUNTER — Other Ambulatory Visit: Payer: Self-pay

## 2020-12-23 VITALS — BP 142/81 | HR 93 | Ht 64.0 in | Wt 175.8 lb

## 2020-12-23 DIAGNOSIS — Z3481 Encounter for supervision of other normal pregnancy, first trimester: Secondary | ICD-10-CM | POA: Diagnosis not present

## 2020-12-23 DIAGNOSIS — Z3491 Encounter for supervision of normal pregnancy, unspecified, first trimester: Secondary | ICD-10-CM | POA: Insufficient documentation

## 2020-12-23 DIAGNOSIS — Z3A11 11 weeks gestation of pregnancy: Secondary | ICD-10-CM | POA: Diagnosis not present

## 2020-12-23 DIAGNOSIS — O3680X0 Pregnancy with inconclusive fetal viability, not applicable or unspecified: Secondary | ICD-10-CM

## 2020-12-23 DIAGNOSIS — O0990 Supervision of high risk pregnancy, unspecified, unspecified trimester: Secondary | ICD-10-CM | POA: Insufficient documentation

## 2020-12-23 DIAGNOSIS — O219 Vomiting of pregnancy, unspecified: Secondary | ICD-10-CM

## 2020-12-23 MED ORDER — BLOOD PRESSURE KIT DEVI
1.0000 | 0 refills | Status: DC
Start: 2020-12-23 — End: 2023-01-19

## 2020-12-23 MED ORDER — GOJJI WEIGHT SCALE MISC: 1.0000 | 0 refills | Status: DC

## 2020-12-23 MED ORDER — PROMETHAZINE HCL 25 MG PO TABS: 25.0000 mg | ORAL_TABLET | Freq: Four times a day (QID) | ORAL | 2 refills | Status: DC | PRN

## 2020-12-23 NOTE — Progress Notes (Signed)
New OB Intake  I connected with  Mackenzie Schultz on 12/23/20 at 10:15 AM EDT by in person. Video Visit and verified that I am speaking with the correct person using two identifiers. Nurse is located at [facility] and pt is located at [place].  I discussed the limitations, risks, security and privacy concerns of performing an evaluation and management service by telephone and the availability of in person appointments. I also discussed with the patient that there may be a patient responsible charge related to this service. The patient expressed understanding and agreed to proceed.  I explained I am completing New OB Intake today. We discussed her EDD of 07/07/21 that is based on LMP of 09/30/20. Pt is G1/P0. I reviewed her allergies, medications, Medical/Surgical/OB history, and appropriate screenings. I informed her of [facility] services. Based on history, this is a/an  pregnancy complicated by hypertension .   There are no problems to display for this patient.   Concerns addressed today  Delivery Plans:  Plans to deliver at [facility].   MyChart/[facility] MyChart access verified. I explained pt will have some visits in office and some virtually. [employer] instructions given and order placed. Patient verifies receipt of registration text/e-mail. Account successfully created and app downloaded.  Blood Pressure Cuff  Blood pressure cuff ordered for patient to pick-up from [employer]. Explained after first prenatal appt pt will check weekly and document in [facility].  Weight scale: Patient does not have weight scale. Weight scale ordered for patient to pick up form [facility].   Anatomy US Explained first scheduled [place] will be around 19 weeks. Dating and viability scan performed today.  Labs Discussed Avelina Laine genetic screening with patient. Would like both Panorama and Horizon drawn at new OB visit. Routine prenatal labs needed.  Covid Vaccine Patient has covid vaccine.   Mother/  Baby Dyad Candidate?    If yes, offer as possibility  Informed patient of [facility] website  and placed link in her AVS.   Social Determinants of Health Food Insecurity: Patient denies food insecurity. WIC Referral: Patient is interested in referral to [facility].  Transportation: Patient rides the bus, but is interested in transportation services. Childcare: Discussed no children allowed at ultrasound appointments. Offered childcare services; patient declines childcare services at this time.  Send link to Pregnancy Navigators   Placed OB Box on problem list and updated  First visit review I reviewed new OB appt with pt. I explained she will have a pelvic exam, ob bloodwork with genetic screening, and PAP smear. Explained pt will be seen by Donia Ast at first visit; encounter routed to appropriate provider. Explained that patient will be seen by pregnancy navigator following visit with provider. [facility] information placed in AVS.   Hamilton Capri, RN 12/23/2020  9:54 AM

## 2020-12-23 NOTE — Progress Notes (Signed)
Patient was assessed and managed by nursing staff during this encounter. I have reviewed the chart and agree with the documentation and plan. I have also made any necessary editorial changes.  Khloie Hamada, MD 12/23/2020 1:03 PM   

## 2021-01-04 ENCOUNTER — Telehealth: Payer: Self-pay

## 2021-01-04 NOTE — Telephone Encounter (Signed)
Two phone calls have been received from [employer] about elevated blood pressures on this patient. One phone was received on Friday 01/01/21 at 11:10 am when I was not doing triage. They called and left message on triage line stating that patient's BP was elevated at 152/92 with nausea and SOB. Pt was not triaged on Friday and did not go to MAU or call the after hours line for any additional help. She did however check her BP again today which prompted [facility] to alert our office to another elevated BP for 157/77 but patient denied any symptoms today. When I tried calling patient back she does not have a voicemail that has been set up yet. I sent patient a MyChart message to have her call our office to speak with her directly to see if she is symptomatic. She is scheduled on 01/06/21 for a NOB visit with Donia Ast, NP. Pt was noted to have an elevated BP reading at her intake visit on 12/23/20 as well as a family history of hypertension. Will try to call patient back again to discuss elevated blood pressure.

## 2021-01-06 ENCOUNTER — Encounter: Payer: Medicaid Other | Admitting: Women's Health

## 2021-01-06 DIAGNOSIS — Z3491 Encounter for supervision of normal pregnancy, unspecified, first trimester: Secondary | ICD-10-CM

## 2021-01-08 ENCOUNTER — Encounter (HOSPITAL_COMMUNITY): Payer: Self-pay

## 2021-01-08 ENCOUNTER — Emergency Department (HOSPITAL_COMMUNITY): Payer: Medicaid Other

## 2021-01-08 ENCOUNTER — Emergency Department (HOSPITAL_COMMUNITY)
Admission: EM | Admit: 2021-01-08 | Discharge: 2021-01-09 | Disposition: A | Discharge status: Home / Self Care | Payer: Medicaid Other | Attending: Emergency Medicine | Admitting: Emergency Medicine

## 2021-01-08 ENCOUNTER — Other Ambulatory Visit: Payer: Self-pay

## 2021-01-08 DIAGNOSIS — Z3A12 12 weeks gestation of pregnancy: Secondary | ICD-10-CM | POA: Diagnosis not present

## 2021-01-08 DIAGNOSIS — M5431 Sciatica, right side: Secondary | ICD-10-CM | POA: Diagnosis not present

## 2021-01-08 DIAGNOSIS — R109 Unspecified abdominal pain: Secondary | ICD-10-CM | POA: Insufficient documentation

## 2021-01-08 DIAGNOSIS — O26891 Other specified pregnancy related conditions, first trimester: Secondary | ICD-10-CM | POA: Diagnosis not present

## 2021-01-08 DIAGNOSIS — I10 Essential (primary) hypertension: Secondary | ICD-10-CM | POA: Insufficient documentation

## 2021-01-08 DIAGNOSIS — J45909 Unspecified asthma, uncomplicated: Secondary | ICD-10-CM | POA: Insufficient documentation

## 2021-01-08 DIAGNOSIS — Z87891 Personal history of nicotine dependence: Secondary | ICD-10-CM | POA: Diagnosis not present

## 2021-01-08 LAB — URINALYSIS, ROUTINE W REFLEX MICROSCOPIC
Bilirubin Urine: NEGATIVE
Glucose, UA: NEGATIVE mg/dL
Ketones, ur: NEGATIVE mg/dL
Leukocytes,Ua: NEGATIVE
Nitrite: NEGATIVE
Protein, ur: NEGATIVE mg/dL
Specific Gravity, Urine: >=1.03 (ref 1.005–1.030)
pH: 6 (ref 5.0–8.0)

## 2021-01-08 LAB — BASIC METABOLIC PANEL
Anion gap: 7 (ref 5–15)
BUN: 14 mg/dL (ref 6–20)
CO2: 22 mmol/L (ref 22–32)
Calcium: 9.2 mg/dL (ref 8.9–10.3)
Chloride: 102 mmol/L (ref 98–111)
Creatinine, Ser: 0.69 mg/dL (ref 0.44–1.00)
GFR, Estimated: >60 mL/min (ref >60)
Glucose, Bld: 95 mg/dL (ref 70–99)
Potassium: 3.7 mmol/L (ref 3.5–5.1)
Sodium: 131 mmol/L — ABNORMAL LOW (ref 135–145)

## 2021-01-08 LAB — CBC
HCT: 36.7 % (ref 36.0–46.0)
Hemoglobin: 12.3 g/dL (ref 12.0–15.0)
MCH: 29.9 pg (ref 26.0–34.0)
MCHC: 33.5 g/dL (ref 30.0–36.0)
MCV: 89.1 fL (ref 80.0–100.0)
Platelets: 254 10*3/uL (ref 150–400)
RBC: 4.12 MIL/uL (ref 3.87–5.11)
RDW: 12.7 % (ref 11.5–15.5)
WBC: 14.2 10*3/uL — ABNORMAL HIGH (ref 4.0–10.5)
nRBC: 0 % (ref 0.0–0.2)

## 2021-01-08 LAB — POC URINE PREG, ED: Preg Test, Ur: POSITIVE — AB

## 2021-01-08 MED ORDER — ACETAMINOPHEN 500 MG PO TABS
1000.0000 mg | ORAL_TABLET | Freq: Once | ORAL | Status: AC
  Administered 2021-01-09: 1000 mg via ORAL
  Filled 2021-01-08: qty 2

## 2021-01-08 NOTE — ED Provider Notes (Signed)
[facility] DEPT Provider Note   CSN: [id number] Arrival date & time: 01/08/21  2055     History Chief Complaint  Patient presents with   Flank Pain    Mackenzie Schultz is a 25 y.o. female G1, P0 who is [redacted] weeks pregnant [facility] emerged department with complaints of right-sided flank pain.  On further history taking and evaluation it appears that patient's pain is actually in her right buttock.  She denies abdominal pain, abdominal cramping, vaginal bleeding or vaginal discharge.  Pain has been intermittent for several weeks and became worse today while she was walking.  No treatments prior to arrival.  Resting and lying on her left side seems to make the right buttock pain better.  She denies numbness, tingling, weakness in the right leg.  Denies saddle anesthesia, loss of bowel or bladder control.  Patient has an appointment with her OB/GYN on Monday.  The history is provided by the patient and medical records. No language interpreter was used.      Past Medical History:  Diagnosis Date   Asthma    Heart murmur    Hypertension    Panic attacks     Patient Active Problem List   Diagnosis Date Noted   Encounter for supervision of normal pregnancy in first trimester 12/23/2020    History reviewed. No pertinent surgical history.   OB History     Gravida  1   Para  0   Term  0   Preterm  0   AB  0   Living  0      SAB  0   IAB  0   Ectopic  0   Multiple  0   Live Births  0           Family History  Problem Relation Age of Onset   Hypertension Mother    Hypertension Father    Hypertension Other     Social History   Tobacco Use   Smoking status: Former    Types: Cigars   Smokeless tobacco: Never  Vaping Use   Vaping Use: Former  Substance Use Topics   Alcohol use: Not Currently    Comment: Occasional, not since confirmed pregnancy   Drug use: No    Home Medications Prior to Admission medications    Medication Sig Start Date End Date Taking? Authorizing Provider  acetaminophen (TYLENOL) 325 MG tablet Take 650 mg by mouth every 6 (six) hours as needed for mild pain or headache.    [provider]  Blood Pressure Monitoring (BLOOD PRESSURE KIT) DEVI 1 kit by Does not apply route once a week. 12/23/20   Constant, Vickii Chafe, MD  Misc. Devices (GOJJI WEIGHT SCALE) MISC 1 Device by Does not apply route every 30 (thirty) days. 12/23/20   Constant, Peggy, MD  ondansetron (ZOFRAN ODT) 4 MG disintegrating tablet Take 1 tablet (4 mg total) by mouth every 8 (eight) hours as needed for nausea or vomiting. 10/28/20   Sponseller, Eugene Garnet R, PA-C  Prenatal Vit-Fe Fumarate-FA (PREPLUS) 27-1 MG TABS Take 1 tablet by mouth daily. 12/01/20   Shelly Bombard, MD  PROAIR HFA [phone] Base) MCG/ACT inhaler 1-2 puffs every 4 (four) hours as needed for wheezing or shortness of breath. 02/01/18   [provider]  promethazine (PHENERGAN) 25 MG tablet Take 1 tablet (25 mg total) by mouth every 6 (six) hours as needed for nausea or vomiting. 12/23/20   Constant, Vickii Chafe, MD  Allergies    Ibuprofen and Mushroom extract complex  Review of Systems   Review of Systems  Constitutional:  Negative for appetite change, diaphoresis, fatigue, fever and unexpected weight change.  HENT:  Negative for mouth sores.   Eyes:  Negative for visual disturbance.  Respiratory:  Negative for cough, chest tightness, shortness of breath and wheezing.   Cardiovascular:  Negative for chest pain.  Gastrointestinal:  Negative for abdominal pain, constipation, diarrhea, nausea and vomiting.  Endocrine: Negative for polydipsia, polyphagia and polyuria.  Genitourinary:  Positive for flank pain. Negative for dysuria, frequency, hematuria and urgency.  Musculoskeletal:  Positive for back pain. Negative for neck stiffness.  Skin:  Negative for rash.  Allergic/Immunologic: Negative for immunocompromised state.  Neurological:  Negative for  syncope, light-headedness and headaches.  Hematological:  Does not bruise/bleed easily.  Psychiatric/Behavioral:  Negative for sleep disturbance. The patient is not nervous/anxious.    Physical Exam Updated Vital Signs BP 120/84   Pulse 95   Temp 98.9 F (37.2 C) (Oral)   Resp 18   Ht 5' 7"  (1.702 m)   Wt 81.6 kg   LMP 09/30/2020 Comment: negative HCG blood test 10-28-2020  SpO2 100%   BMI 28.16 kg/m   Physical Exam Vitals and nursing note reviewed.  Constitutional:      General: She is not in acute distress.    Appearance: She is not diaphoretic.  HENT:     Head: Normocephalic.  Eyes:     General: No scleral icterus.    Conjunctiva/sclera: Conjunctivae normal.  Cardiovascular:     Rate and Rhythm: Normal rate and regular rhythm.     Pulses: Normal pulses.          Radial pulses are 2+ on the right side and 2+ on the left side.  Pulmonary:     Effort: No tachypnea, accessory muscle usage, prolonged expiration, respiratory distress or retractions.     Breath sounds: No stridor.     Comments: Equal chest rise. No increased work of breathing. Abdominal:     General: There is no distension.     Palpations: Abdomen is soft.     Tenderness: There is no abdominal tenderness. There is no guarding or rebound.  Musculoskeletal:     Cervical back: Normal range of motion.     Thoracic back: Normal.     Lumbar back: No bony tenderness. Normal range of motion.       Back:     Comments: Moves all extremities equally and without difficulty.  Skin:    General: Skin is warm and dry.     Capillary Refill: Capillary refill takes less than 2 seconds.  Neurological:     Mental Status: She is alert.     GCS: GCS eye subscore is 4. GCS verbal subscore is 5. GCS motor subscore is 6.     Comments: Speech is clear and goal oriented.  Psychiatric:        Mood and Affect: Mood normal.    ED Results / Procedures / Treatments   Labs (all labs ordered are listed, but only abnormal results  are displayed) Labs Reviewed  URINALYSIS, ROUTINE W REFLEX MICROSCOPIC - Abnormal; Notable for the following components:      Result Value   Color, Urine YELLOW (*)    APPearance HAZY (*)    Hgb urine dipstick TRACE (*)    Bacteria, UA RARE (*)    All other components within normal limits  CBC - Abnormal; Notable  for the following components:   WBC 14.2 (*)    All other components within normal limits  BASIC METABOLIC PANEL - Abnormal; Notable for the following components:   Sodium 131 (*)    All other components within normal limits  POC URINE PREG, ED - Abnormal; Notable for the following components:   Preg Test, Ur POSITIVE (*)    All other components within normal limits     Radiology US Renal  Result Date: 01/08/2021 CLINICAL DATA:  Flank pain pregnant patient EXAM: RENAL / URINARY TRACT ULTRASOUND COMPLETE COMPARISON:  CT 10/28/2020 FINDINGS: Right Kidney: Renal measurements: 10.2 x 4.2 x 6.2 cm = volume: 139.1 mL. Echogenicity within normal limits. No mass or hydronephrosis visualized. Left Kidney: Renal measurements: 10.5 x 6 x 6 cm = volume: 200.4 mL. Echogenicity within normal limits. No mass or hydronephrosis visualized. Bladder: Appears normal for degree of bladder distention. Other: None. IMPRESSION: Negative renal ultrasound Electronically Signed   By: Donavan Foil M.D.   On: 01/08/2021 22:50    Procedures Procedures   Medications Ordered in ED Medications  acetaminophen (TYLENOL) tablet 1,000 mg (has no administration in time range)    ED Course  I have reviewed the triage vital signs and the nursing notes.  Pertinent labs & imaging results that were available during my care of the patient were reviewed by me and considered in my medical decision making (see chart for details).    MDM Rules/Calculators/A&P                           Presents with right buttock pain.  Palpation of the right buttock elicits pain and radiation.  No pain to palpation of the  central L-spine.  Patient ambulatory but with pain.  Normal neurologic exam.  No saddle anesthesia.  Strength in the bilateral lower extremities 5/5.  Recommend gentle stretching, follow-up with primary care and Tylenol for pain control.  Work-up here in the emergency department reassuring.  No evidence of urinary tract infection.  No hydronephrosis to suggest renal colic.  No blood in her urine.  Patient's not dehydrated.  No nausea or vomiting.  Abdomen soft and nontender.  FHT: 157  Final Clinical Impression(s) / ED Diagnoses Final diagnoses:  Sciatica of right side    Rx / DC Orders ED Discharge Orders     None        Saran Laviolette, Gwenlyn Perking 01/09/21 Bradd Burner, MD 01/09/21 [phone]

## 2021-01-08 NOTE — ED Triage Notes (Signed)
Pt reports right flank pain worsening over 1 week. [redacted] weeks pregnant. Denies urinary sx.

## 2021-01-09 NOTE — Discharge Instructions (Addendum)
1. Medications:tylenol for pain, usual home medications 2. Treatment: rest, drink plenty of fluids, gentle stretching as discussed, alternate ice and heat 3. Follow Up: Please followup with your primary doctor in 2-3 days for discussion of your diagnoses and further evaluation after today's visit; if you do not have a primary care doctor use the resource guide provided to find one;  Return to the ER for worsening back pain, difficulty walking, loss of bowel or bladder control or other concerning symptoms

## 2021-01-09 NOTE — ED Notes (Signed)
Fetal heart rate 157 beats per minute.

## 2021-01-11 ENCOUNTER — Other Ambulatory Visit (HOSPITAL_COMMUNITY)
Admission: RE | Admit: 2021-01-11 | Discharge: 2021-01-11 | Disposition: A | Discharge status: Home / Self Care | Payer: Medicaid Other | Source: Ambulatory Visit | Attending: Women's Health | Admitting: Women's Health

## 2021-01-11 ENCOUNTER — Other Ambulatory Visit: Payer: Self-pay

## 2021-01-11 ENCOUNTER — Encounter: Payer: Self-pay | Admitting: Obstetrics and Gynecology

## 2021-01-11 ENCOUNTER — Ambulatory Visit (INDEPENDENT_AMBULATORY_CARE_PROVIDER_SITE_OTHER): Payer: Medicaid Other | Admitting: Obstetrics and Gynecology

## 2021-01-11 DIAGNOSIS — Z3491 Encounter for supervision of normal pregnancy, unspecified, first trimester: Secondary | ICD-10-CM | POA: Diagnosis present

## 2021-01-11 DIAGNOSIS — O99210 Obesity complicating pregnancy, unspecified trimester: Secondary | ICD-10-CM | POA: Insufficient documentation

## 2021-01-11 DIAGNOSIS — Z3A14 14 weeks gestation of pregnancy: Secondary | ICD-10-CM | POA: Diagnosis not present

## 2021-01-11 LAB — HEPATITIS C ANTIBODY: HCV Ab: NEGATIVE

## 2021-01-11 MED ORDER — VITAFOL GUMMIES 3.33-0.333-34.8 MG PO CHEW: 2.0000 | CHEWABLE_TABLET | Freq: Every day | ORAL | 6 refills | Status: AC

## 2021-01-11 MED ORDER — ASPIRIN EC 81 MG PO TBEC: 81.0000 mg | DELAYED_RELEASE_TABLET | Freq: Every day | ORAL | 2 refills | Status: DC

## 2021-01-11 NOTE — Patient Instructions (Signed)
Second Trimester of Pregnancy °The second trimester of pregnancy is from week 13 through week 27. This is months 4 through 6 of pregnancy. The second trimester is often a time when you feel your best. Your body has adjusted to being pregnant, and you begin to feel better physically. °During the second trimester: °Morning sickness has lessened or stopped completely. °You may have more energy. °You may have an increase in appetite. °The second trimester is also a time when the unborn baby (fetus) is growing rapidly. At the end of the sixth month, the fetus may be up to 12 inches long and weigh about 1½ pounds. You will likely begin to feel the baby move (quickening) between 16 and 20 weeks of pregnancy. °Body changes during your second trimester °Your body continues to go through many changes during your second trimester. The changes vary and generally return to normal after the baby is born. °Physical changes °Your weight will continue to increase. You will notice your lower abdomen bulging out. °You may begin to get stretch marks on your hips, abdomen, and breasts. °Your breasts will continue to grow and to become tender. °Dark spots or blotches (chloasma or mask of pregnancy) may develop on your face. °A dark line from your belly button to the pubic area (linea nigra) may appear. °You may have changes in your hair. These can include thickening of your hair, rapid growth, and changes in texture. Some people also have hair loss during or after pregnancy, or hair that feels dry or thin. °Health changes °You may develop headaches. °You may have heartburn. °You may develop constipation. °You may develop hemorrhoids or swollen, bulging veins (varicose veins). °Your gums may bleed and may be sensitive to brushing and flossing. °You may urinate more often because the fetus is pressing on your bladder. °You may have back pain. This is caused by: °Weight gain. °Pregnancy hormones that are relaxing the joints in your  pelvis. °A shift in weight and the muscles that support your balance. °Follow these instructions at home: °Medicines °Follow your health care provider's instructions regarding medicine use. Specific medicines may be either safe or unsafe to take during pregnancy. Do not take any medicines unless approved by your health care provider. °Take a prenatal vitamin that contains at least 600 micrograms (mcg) of folic acid. °Eating and drinking °Eat a healthy diet that includes fresh fruits and vegetables, whole grains, good sources of protein such as meat, eggs, or tofu, and low-fat dairy products. °Avoid raw meat and unpasteurized juice, milk, and cheese. These carry germs that can harm you and your baby. °You may need to take these actions to prevent or treat constipation: °Drink enough fluid to keep your urine pale yellow. °Eat foods that are high in fiber, such as beans, whole grains, and fresh fruits and vegetables. °Limit foods that are high in fat and processed sugars, such as fried or sweet foods. °Activity °Exercise only as directed by your health care provider. Most people can continue their usual exercise routine during pregnancy. Try to exercise for 30 minutes at least 5 days a week. Stop exercising if you develop contractions in your uterus. °Stop exercising if you develop pain or cramping in the lower abdomen or lower back. °Avoid exercising if it is very hot or humid or if you are at a high altitude. °Avoid heavy lifting. °If you choose to, you may have sex unless your health care provider tells you not to. °Relieving pain and discomfort °Wear a supportive bra   to prevent discomfort from breast tenderness. °Take warm sitz baths to soothe any pain or discomfort caused by hemorrhoids. Use hemorrhoid cream if your health care provider approves. °Rest with your legs raised (elevated) if you have leg cramps or low back pain. °If you develop varicose veins: °Wear support hose as told by your health care  provider. °Elevate your feet for 15 minutes, 3-4 times a day. °Limit salt in your diet. °Safety °Wear your seat belt at all times when driving or riding in a car. °Talk with your health care provider if someone is verbally or physically abusive to you. °Lifestyle °Do not use hot tubs, steam rooms, or saunas. °Do not douche. Do not use tampons or scented sanitary pads. °Avoid cat litter boxes and soil used by cats. These carry germs that can cause birth defects in the baby and possibly loss of the fetus by miscarriage or stillbirth. °Do not use herbal remedies, alcohol, illegal drugs, or medicines that are not approved by your health care provider. Chemicals in these products can harm your baby. °Do not use any products that contain nicotine or tobacco, such as cigarettes, e-cigarettes, and chewing tobacco. If you need help quitting, ask your health care provider. °General instructions °During a routine prenatal visit, your health care provider will do a physical exam and other tests. He or she will also discuss your overall health. Keep all follow-up visits. This is important. °Ask your health care provider for a referral to a local prenatal education class. °Ask for help if you have counseling or nutritional needs during pregnancy. Your health care provider can offer advice or refer you to specialists for help with various needs. °Where to find more information °American Pregnancy Association: americanpregnancy.org °American College of Obstetricians and Gynecologists: acog.org/en/Womens%20Health/Pregnancy °Office on Women's Health: womenshealth.gov/pregnancy °Contact a health care provider if you have: °A headache that does not go away when you take medicine. °Vision changes or you see spots in front of your eyes. °Mild pelvic cramps, pelvic pressure, or nagging pain in the abdominal area. °Persistent nausea, vomiting, or diarrhea. °A bad-smelling vaginal discharge or foul-smelling urine. °Pain when you  urinate. °Sudden or extreme swelling of your face, hands, ankles, feet, or legs. °A fever. °Get help right away if you: °Have fluid leaking from your vagina. °Have spotting or bleeding from your vagina. °Have severe abdominal cramping or pain. °Have difficulty breathing. °Have chest pain. °Have fainting spells. °Have not felt your baby move for the time period told by your health care provider. °Have new or increased pain, swelling, or redness in an arm or leg. °Summary °The second trimester of pregnancy is from week 13 through week 27 (months 4 through 6). °Do not use herbal remedies, alcohol, illegal drugs, or medicines that are not approved by your health care provider. Chemicals in these products can harm your baby. °Exercise only as directed by your health care provider. Most people can continue their usual exercise routine during pregnancy. °Keep all follow-up visits. This is important. °This information is not intended to replace advice given to you by your health care provider. Make sure you discuss any questions you have with your health care provider. °Document Revised: 09/11/2019 Document Reviewed: 07/18/2019 °Elsevier Patient Education © 2022 [employer] ° °Contraception Choices °Contraception, also called birth control, refers to methods or devices that prevent pregnancy. °Hormonal methods °Contraceptive implant °A contraceptive implant is a thin, plastic tube that contains a hormone that prevents pregnancy. It is different from an intrauterine device (IUD). It   is inserted into the upper part of the arm by a health care provider. Implants can be effective for up to 3 years. °Progestin-only injections °Progestin-only injections are injections of progestin, a synthetic form of the hormone progesterone. They are given every 3 months by a health care provider. °Birth control pills °Birth control pills are pills that contain hormones that prevent pregnancy. They must be taken once a day, preferably at the  same time each day. A prescription is needed to use this method of contraception. °Birth control patch °The birth control patch contains hormones that prevent pregnancy. It is placed on the skin and must be changed once a week for three weeks and removed on the fourth week. A prescription is needed to use this method of contraception. °Vaginal ring °A vaginal ring contains hormones that prevent pregnancy. It is placed in the vagina for three weeks and removed on the fourth week. After that, the process is repeated with a new ring. A prescription is needed to use this method of contraception. °Emergency contraceptive °Emergency contraceptives prevent pregnancy after unprotected sex. They come in pill form and can be taken up to 5 days after sex. They work best the sooner they are taken after having sex. Most emergency contraceptives are available without a prescription. This method should not be used as your only form of birth control. °Barrier methods °Female condom °A female condom is a thin sheath that is worn over the penis during sex. Condoms keep sperm from going inside a woman's body. They can be used with a sperm-killing substance (spermicide) to increase their effectiveness. They should be thrown away after one use. °Female condom °A female condom is a soft, loose-fitting sheath that is put into the vagina before sex. The condom keeps sperm from going inside a woman's body. They should be thrown away after one use. °Diaphragm °A diaphragm is a soft, dome-shaped barrier. It is inserted into the vagina before sex, along with a spermicide. The diaphragm blocks sperm from entering the uterus, and the spermicide kills sperm. A diaphragm should be left in the vagina for 6-8 hours after sex and removed within 24 hours. °A diaphragm is prescribed and fitted by a health care provider. A diaphragm should be replaced every 1-2 years, after giving birth, after gaining more than 15 lb (6.8 kg), and after pelvic  surgery. °Cervical cap °A cervical cap is a round, soft latex or plastic cup that fits over the cervix. It is inserted into the vagina before sex, along with spermicide. It blocks sperm from entering the uterus. The cap should be left in place for 6-8 hours after sex and removed within 48 hours. A cervical cap must be prescribed and fitted by a health care provider. It should be replaced every 2 years. °Sponge °A sponge is a soft, circular piece of polyurethane foam with spermicide in it. The sponge helps block sperm from entering the uterus, and the spermicide kills sperm. To use it, you make it wet and then insert it into the vagina. It should be inserted before sex, left in for at least 6 hours after sex, and removed and thrown away within 30 hours. °Spermicides °Spermicides are chemicals that kill or block sperm from entering the cervix and uterus. They can come as a cream, jelly, suppository, foam, or tablet. A spermicide should be inserted into the vagina with an applicator at least 10-15 minutes before sex to allow time for it to work. The process must be repeated every time   you have sex. Spermicides do not require a prescription. °Intrauterine contraception °Intrauterine device (IUD) °An IUD is a T-shaped device that is put in a woman's uterus. There are two types: °Hormone IUD.This type contains progestin, a synthetic form of the hormone progesterone. This type can stay in place for 3-5 years. °Copper IUD.This type is wrapped in copper wire. It can stay in place for 10 years. °Permanent methods of contraception °Female tubal ligation °In this method, a woman's fallopian tubes are sealed, tied, or blocked during surgery to prevent eggs from traveling to the uterus. °Hysteroscopic sterilization °In this method, a small, flexible insert is placed into each fallopian tube. The inserts cause scar tissue to form in the fallopian tubes and block them, so sperm cannot reach an egg. The procedure takes about 3  months to be effective. Another form of birth control must be used during those 3 months. °Female sterilization °This is a procedure to tie off the tubes that carry sperm (vasectomy). After the procedure, the man can still ejaculate fluid (semen). Another form of birth control must be used for 3 months after the procedure. °Natural planning methods °Natural family planning °In this method, a couple does not have sex on days when the woman could become pregnant. °Calendar method °In this method, the woman keeps track of the length of each menstrual cycle, identifies the days when pregnancy can happen, and does not have sex on those days. °Ovulation method °In this method, a couple avoids sex during ovulation. °Symptothermal method °This method involves not having sex during ovulation. The woman typically checks for ovulation by watching changes in her temperature and in the consistency of cervical mucus. °Post-ovulation method °In this method, a couple waits to have sex until after ovulation. °Where to find more information °Centers for Disease Control and Prevention: www.cdc.gov °Summary °Contraception, also called birth control, refers to methods or devices that prevent pregnancy. °Hormonal methods of contraception include implants, injections, pills, patches, vaginal rings, and emergency contraceptives. °Barrier methods of contraception can include female condoms, female condoms, diaphragms, cervical caps, sponges, and spermicides. °There are two types of IUDs (intrauterine devices). An IUD can be put in a woman's uterus to prevent pregnancy for 3-5 years. °Permanent sterilization can be done through a procedure for males and females. Natural family planning methods involve nothaving sex on days when the woman could become pregnant. °This information is not intended to replace advice given to you by your health care provider. Make sure you discuss any questions you have with your health care provider. °Document  Revised: 09/09/2019 Document Reviewed: 09/09/2019 °Elsevier Patient Education © 2022 Elsevier Inc. ° °

## 2021-01-11 NOTE — Progress Notes (Signed)
NEW OB, declined FLU vaccine.  Reports no problems

## 2021-01-11 NOTE — Progress Notes (Signed)
  Subjective:    Mackenzie Schultz is a G1P0000 [redacted]w[redacted]d being seen today for her first obstetrical visit.  Her obstetrical history is significant for obesity. Patient does intend to breast feed. Pregnancy history fully reviewed.  Patient reports no complaints.  Vitals:   01/11/21 0852  BP: 122/78  Pulse: (!) 102  Weight: 178 lb (80.7 kg)    HISTORY: OB History  Gravida Para Term Preterm AB Living  1 0 0 0 0 0  SAB IAB Ectopic Multiple Live Births  0 0 0 0 0    # Outcome Date GA Lbr Len/2nd Weight Sex Delivery Anes PTL Lv  1 Current            Past Medical History:  Diagnosis Date  . Asthma   . Heart murmur   . Hypertension   . Panic attacks    History reviewed. No pertinent surgical history. Family History  Problem Relation Age of Onset  . Hypertension Mother   . Hypertension Father   . Hypertension Other      Exam    Uterus:     Pelvic Exam:    Perineum: No Hemorrhoids, Normal Perineum   Vulva: normal   Vagina:  normal mucosa, normal discharge   pH:    Cervix: nulliparous appearance and closed and long   Adnexa: not evaluated   Bony Pelvis: gynecoid  System: Breast:  normal appearance, no masses or tenderness   Skin: normal coloration and turgor, no rashes    Neurologic: oriented, no focal deficits   Extremities: normal strength, tone, and muscle mass   HEENT extra ocular movement intact   Mouth/Teeth mucous membranes moist, pharynx normal without lesions and dental hygiene good   Neck supple and no masses   Cardiovascular: regular rate and rhythm   Respiratory:  appears well, vitals normal, no respiratory distress, acyanotic, normal RR, chest clear, no wheezing, crepitations, rhonchi, normal symmetric air entry   Abdomen: soft, non-tender; bowel sounds normal; no masses,  no organomegaly   Urinary:       Assessment:    Pregnancy: G1P0000 Patient Active Problem List   Diagnosis Date Noted  . Maternal obesity affecting pregnancy, antepartum  01/11/2021  . Encounter for supervision of normal pregnancy in first trimester 12/23/2020        Plan:     Initial labs drawn. Prenatal vitamins. Problem list reviewed and updated. Genetic Screening discussed : panorama and carrier screening ordered.  Ultrasound discussed; fetal survey: ordered. Rx ASA provided  Follow up in 4 weeks. 50% of 30 min visit spent on counseling and coordination of care.     Domingue Coltrain 01/11/2021

## 2021-01-12 LAB — OBSTETRIC PANEL, INCLUDING HIV
Antibody Screen: NEGATIVE
Basophils Absolute: 0 10*3/uL (ref 0.0–0.2)
Basos: 0 %
EOS (ABSOLUTE): 0 10*3/uL (ref 0.0–0.4)
Eos: 0 %
HIV Screen 4th Generation wRfx: NONREACTIVE
Hematocrit: 38.5 % (ref 34.0–46.6)
Hemoglobin: 12.6 g/dL (ref 11.1–15.9)
Hepatitis B Surface Ag: NEGATIVE
Immature Grans (Abs): 0 10*3/uL (ref 0.0–0.1)
Immature Granulocytes: 0 %
Lymphocytes Absolute: 1.4 10*3/uL (ref 0.7–3.1)
Lymphs: 15 %
MCH: 29.4 pg (ref 26.6–33.0)
MCHC: 32.7 g/dL (ref 31.5–35.7)
MCV: 90 fL (ref 79–97)
Monocytes Absolute: 0.5 10*3/uL (ref 0.1–0.9)
Monocytes: 5 %
Neutrophils Absolute: 7.7 10*3/uL — ABNORMAL HIGH (ref 1.4–7.0)
Neutrophils: 80 %
Platelets: 268 10*3/uL (ref 150–450)
RBC: 4.28 x10E6/uL (ref 3.77–5.28)
RDW: 13.2 % (ref 11.7–15.4)
RPR Ser Ql: NONREACTIVE
Rh Factor: POSITIVE
Rubella Antibodies, IGG: 1.2 index (ref >0.99)
WBC: 9.6 10*3/uL (ref 3.4–10.8)

## 2021-01-12 LAB — CERVICOVAGINAL ANCILLARY ONLY
Chlamydia: NEGATIVE
Comment: NEGATIVE
Comment: NORMAL
Neisseria Gonorrhea: NEGATIVE

## 2021-01-12 LAB — HEMOGLOBIN A1C
Est. average glucose Bld gHb Est-mCnc: 111 mg/dL
Hgb A1c MFr Bld: 5.5 % (ref 4.8–5.6)

## 2021-01-12 LAB — HEPATITIS C ANTIBODY: Hep C Virus Ab: <0.1 s/co ratio (ref 0.0–0.9)

## 2021-01-13 LAB — CYTOLOGY - PAP
Comment: NEGATIVE
Diagnosis: NEGATIVE
High risk HPV: NEGATIVE

## 2021-01-13 LAB — URINE CULTURE, OB REFLEX

## 2021-01-13 LAB — CULTURE, OB URINE

## 2021-01-20 ENCOUNTER — Encounter: Payer: Self-pay | Admitting: Obstetrics and Gynecology

## 2021-02-08 ENCOUNTER — Ambulatory Visit (INDEPENDENT_AMBULATORY_CARE_PROVIDER_SITE_OTHER): Payer: Medicaid Other | Admitting: Advanced Practice Midwife

## 2021-02-08 ENCOUNTER — Other Ambulatory Visit: Payer: Self-pay

## 2021-02-08 ENCOUNTER — Encounter: Payer: Self-pay | Admitting: Advanced Practice Midwife

## 2021-02-08 ENCOUNTER — Encounter: Payer: Medicaid Other | Admitting: Advanced Practice Midwife

## 2021-02-08 VITALS — BP 129/80 | HR 80 | Wt 180.7 lb

## 2021-02-08 DIAGNOSIS — Z3A18 18 weeks gestation of pregnancy: Secondary | ICD-10-CM

## 2021-02-08 DIAGNOSIS — Z3400 Encounter for supervision of normal first pregnancy, unspecified trimester: Secondary | ICD-10-CM

## 2021-02-08 DIAGNOSIS — O10912 Unspecified pre-existing hypertension complicating pregnancy, second trimester: Secondary | ICD-10-CM

## 2021-02-08 DIAGNOSIS — R011 Cardiac murmur, unspecified: Secondary | ICD-10-CM

## 2021-02-08 DIAGNOSIS — R519 Headache, unspecified: Secondary | ICD-10-CM

## 2021-02-08 DIAGNOSIS — O26892 Other specified pregnancy related conditions, second trimester: Secondary | ICD-10-CM

## 2021-02-08 MED ORDER — CYCLOBENZAPRINE HCL 5 MG PO TABS: 5.0000 mg | ORAL_TABLET | Freq: Every evening | ORAL | 0 refills | Status: DC | PRN

## 2021-02-08 NOTE — Progress Notes (Signed)
   PRENATAL VISIT NOTE  Subjective:  Mackenzie Schultz is a 25 y.o. G1P0000 at [redacted]w[redacted]d being seen today for ongoing prenatal care.  She is currently monitored for the following issues for this low-risk pregnancy and has Encounter for supervision of normal pregnancy in first trimester; Maternal obesity affecting pregnancy, antepartum; and Heart murmur on their problem list.  Patient reports headache.  Contractions: Not present. Vag. Bleeding: None.  Movement: Present. Denies leaking of fluid.   The following portions of the patient's history were reviewed and updated as appropriate: allergies, current medications, past family history, past medical history, past social history, past surgical history and problem list.   Objective:   Vitals:   02/08/21 0903  BP: 129/80  Pulse: 80  Weight: 180 lb 11.2 oz (82 kg)    Fetal Status: Fetal Heart Rate (bpm): 160   Movement: Present     General:  Alert, oriented and cooperative. Patient is in no acute distress.  Skin: Skin is warm and dry. No rash noted.   Cardiovascular: Normal heart rate noted  Respiratory: Normal respiratory effort, no problems with respiration noted  Abdomen: Soft, gravid, appropriate for gestational age.  Pain/Pressure: Absent     Pelvic: Cervical exam deferred        Extremities: Normal range of motion.  Edema: None  Mental Status: Normal mood and affect. Normal behavior. Normal judgment and thought content.   Assessment and Plan:  Pregnancy: G1P0000 at [redacted]w[redacted]d 1. Supervision of normal first pregnancy, antepartum --Anticipatory guidance about next visits/weeks of pregnancy given. --Next visit in 4 weeks  - AFP, Serum, Open Spina Bifida  2. [redacted] weeks gestation of pregnancy   3. Headache in pregnancy, antepartum, second trimester --headaches daily, pt drinking water, discussed how much is recommended --H/a unilateral, with photophobia, preexisting. Likely migraine, worsened by pregnancy. --?CHTN, pt reports h/a in  past related to high blood pressure so will chart review --Try Tylenol and small amounts of caffeine PRN --Keep log related to h/a --F/U with Clydie Braun  - cyclobenzaprine (FLEXERIL) 5 MG tablet; Take 1 tablet (5 mg total) by mouth at bedtime as needed for muscle spasms (headaches).  Dispense: 20 tablet; Refill: 0 - AMB referral to headache clinic  4. Chronic hypertension in obstetric context in second trimester --On chart review, pt with hx HTN at previous ED visits on 01/25/18 and 09/12/20.  BP borderline, 130s/80s at other surrounding visits. --Will continue to monitor during pregnancy, follow antenatal testing guidelines  5. Heart murmur --Pt reports disability related to heart condition.  Had heart murmur as infant. No cardiology follow up since infancy/childhood.   --Consider cardiology f/u during pregnancy  Preterm labor symptoms and general obstetric precautions including but not limited to vaginal bleeding, contractions, leaking of fluid and fetal movement were reviewed in detail with the patient. Please refer to After Visit Summary for other counseling recommendations.   Return in about 4 weeks (around 03/08/2021).  Future Appointments  Date Time Provider Department Center  02/10/2021 10:30 AM [facility]  03/08/2021 10:35 AM [facility], Wilmer Floor, CNM [facility] None    Sharen Counter, CNM

## 2021-02-08 NOTE — Progress Notes (Signed)
Pt presents for ROB c/o HA's.  Pt afraid to take medication for HA's because of her heart condition. AFP due today [facility] u/s scheduled 02/10/21

## 2021-02-09 ENCOUNTER — Encounter: Payer: Medicaid Other | Admitting: Advanced Practice Midwife

## 2021-02-10 ENCOUNTER — Other Ambulatory Visit: Payer: Self-pay

## 2021-02-10 ENCOUNTER — Ambulatory Visit: Payer: Medicaid Other | Attending: Obstetrics and Gynecology

## 2021-02-10 ENCOUNTER — Other Ambulatory Visit: Payer: Self-pay | Admitting: Obstetrics and Gynecology

## 2021-02-10 ENCOUNTER — Other Ambulatory Visit: Payer: Self-pay | Admitting: *Deleted

## 2021-02-10 DIAGNOSIS — Z3491 Encounter for supervision of normal pregnancy, unspecified, first trimester: Secondary | ICD-10-CM | POA: Diagnosis not present

## 2021-02-10 DIAGNOSIS — O10912 Unspecified pre-existing hypertension complicating pregnancy, second trimester: Secondary | ICD-10-CM

## 2021-02-10 LAB — AFP, SERUM, OPEN SPINA BIFIDA
AFP MoM: 0.77
AFP Value: 37.1 ng/mL
Gest. Age on Collection Date: 18.5 weeks
Maternal Age At EDD: 27.2 yr
OSBR Risk 1 IN: 10000
Test Results:: NEGATIVE
Weight: 180 [lb_av]

## 2021-02-25 ENCOUNTER — Other Ambulatory Visit: Payer: Self-pay

## 2021-02-25 ENCOUNTER — Encounter (HOSPITAL_COMMUNITY): Payer: Self-pay | Admitting: Oncology

## 2021-02-25 ENCOUNTER — Emergency Department (HOSPITAL_COMMUNITY)
Admission: EM | Admit: 2021-02-25 | Discharge: 2021-02-25 | Disposition: A | Discharge status: Home / Self Care | Payer: Medicaid Other | Attending: Emergency Medicine | Admitting: Emergency Medicine

## 2021-02-25 DIAGNOSIS — Z87891 Personal history of nicotine dependence: Secondary | ICD-10-CM | POA: Insufficient documentation

## 2021-02-25 DIAGNOSIS — Z7951 Long term (current) use of inhaled steroids: Secondary | ICD-10-CM | POA: Insufficient documentation

## 2021-02-25 DIAGNOSIS — J45909 Unspecified asthma, uncomplicated: Secondary | ICD-10-CM | POA: Insufficient documentation

## 2021-02-25 DIAGNOSIS — O23592 Infection of other part of genital tract in pregnancy, second trimester: Secondary | ICD-10-CM | POA: Insufficient documentation

## 2021-02-25 DIAGNOSIS — Z3A18 18 weeks gestation of pregnancy: Secondary | ICD-10-CM | POA: Diagnosis not present

## 2021-02-25 DIAGNOSIS — B9689 Other specified bacterial agents as the cause of diseases classified elsewhere: Secondary | ICD-10-CM | POA: Insufficient documentation

## 2021-02-25 DIAGNOSIS — N760 Acute vaginitis: Secondary | ICD-10-CM | POA: Insufficient documentation

## 2021-02-25 DIAGNOSIS — Z79899 Other long term (current) drug therapy: Secondary | ICD-10-CM | POA: Diagnosis not present

## 2021-02-25 DIAGNOSIS — I10 Essential (primary) hypertension: Secondary | ICD-10-CM | POA: Insufficient documentation

## 2021-02-25 DIAGNOSIS — Z7982 Long term (current) use of aspirin: Secondary | ICD-10-CM | POA: Diagnosis not present

## 2021-02-25 DIAGNOSIS — N898 Other specified noninflammatory disorders of vagina: Secondary | ICD-10-CM | POA: Insufficient documentation

## 2021-02-25 LAB — URINALYSIS, ROUTINE W REFLEX MICROSCOPIC
Bilirubin Urine: NEGATIVE
Glucose, UA: NEGATIVE mg/dL
Ketones, ur: NEGATIVE mg/dL
Nitrite: NEGATIVE
Protein, ur: NEGATIVE mg/dL
Specific Gravity, Urine: 1.004 — ABNORMAL LOW (ref 1.005–1.030)
pH: 7 (ref 5.0–8.0)

## 2021-02-25 LAB — WET PREP, GENITAL
Sperm: NONE SEEN
Trich, Wet Prep: NONE SEEN
Yeast Wet Prep HPF POC: NONE SEEN

## 2021-02-25 MED ORDER — METRONIDAZOLE 500 MG PO TABS
500.0000 mg | ORAL_TABLET | Freq: Once | ORAL | Status: AC
  Administered 2021-02-25: 500 mg via ORAL
  Filled 2021-02-25: qty 1

## 2021-02-25 MED ORDER — METRONIDAZOLE 500 MG PO TABS: 500.0000 mg | ORAL_TABLET | Freq: Two times a day (BID) | ORAL | 0 refills | Status: DC

## 2021-02-25 NOTE — ED Provider Notes (Signed)
[facility] EMERGENCY DEPARTMENT Provider Note  CSN: [id number] Arrival date & time: 02/25/21 1346    History Chief Complaint  Patient presents with   Vaginal Itching    Mackenzie Schultz is a 25 y.o. female who is G1P0 at approx 18wks reports several days of vaginal irritation, yellow discharge and spotting with wiping. She denies any dysuria. She has not had sexual intercourse since June. She called Ob office but they were unable to see her today. No fever. Baby has been moving normally. Not passing fluid or blood clots. No abdominal/pelvic pain. She does not shave her pubic area.    Past Medical History:  Diagnosis Date   Asthma    Heart murmur    Hypertension    Panic attacks     History reviewed. No pertinent surgical history.  Family History  Problem Relation Age of Onset   Hypertension Mother    Hypertension Father    Hypertension Other     Social History   Tobacco Use   Smoking status: Former    Types: Cigars   Smokeless tobacco: Never  Vaping Use   Vaping Use: Former  Substance Use Topics   Alcohol use: Not Currently    Comment: Occasional, not since confirmed pregnancy   Drug use: No     Home Medications Prior to Admission medications   Medication Sig Start Date End Date Taking? Authorizing Provider  metroNIDAZOLE (FLAGYL) 500 MG tablet Take 1 tablet (500 mg total) by mouth 2 (two) times daily. 02/25/21  Yes Truddie Hidden, MD  acetaminophen (TYLENOL) 325 MG tablet Take 650 mg by mouth every 6 (six) hours as needed for mild pain or headache.    [provider]  aspirin EC 81 MG tablet Take 1 tablet (81 mg total) by mouth daily. Take after 12 weeks for prevention of preeclampsia later in pregnancy 01/11/21   Constant, Peggy, MD  Blood Pressure Monitoring (BLOOD PRESSURE KIT) DEVI 1 kit by Does not apply route once a week. 12/23/20   Constant, Peggy, MD  cyclobenzaprine (FLEXERIL) 5 MG tablet Take 1 tablet (5 mg total) by mouth at bedtime  as needed for muscle spasms (headaches). 02/08/21   Leftwich-Kirby, Kathie Dike, CNM  Prenatal Vit-Fe Fumarate-FA (PREPLUS) 27-1 MG TABS Take 1 tablet by mouth daily. 12/01/20   Shelly Bombard, MD  PROAIR HFA 108 [id number] Base) MCG/ACT inhaler 1-2 puffs every 4 (four) hours as needed for wheezing or shortness of breath. 02/01/18   [provider]  promethazine (PHENERGAN) 25 MG tablet Take 1 tablet (25 mg total) by mouth every 6 (six) hours as needed for nausea or vomiting. 12/23/20   Constant, Peggy, MD     Allergies    Ibuprofen and Mushroom extract complex   Review of Systems   Review of Systems A comprehensive review of systems was completed and negative except as noted in HPI.    Physical Exam BP 125/86   Pulse 81   Temp 98 F (36.7 C) (Oral)   Resp 15   Ht 5' 4"  (1.626 m)   Wt 81.6 kg   LMP 09/30/2020 Comment: negative HCG blood test 10-28-2020  SpO2 99%   BMI 30.90 kg/m   Physical Exam Vitals and nursing note reviewed.  Constitutional:      Appearance: Normal appearance.  HENT:     Head: Normocephalic and atraumatic.     Nose: Nose normal.     Mouth/Throat:     Mouth: Mucous membranes are  moist.  Eyes:     Extraocular Movements: Extraocular movements intact.     Conjunctiva/sclera: Conjunctivae normal.  Cardiovascular:     Rate and Rhythm: Normal rate.  Pulmonary:     Effort: Pulmonary effort is normal.     Breath sounds: Normal breath sounds.  Abdominal:     General: Abdomen is flat.     Palpations: Abdomen is soft.     Tenderness: There is no abdominal tenderness.     Comments: Gravid consistent with dates  Genitourinary:    Comments: Chaperone present. No external vesicles or lesions. There is thick yellow discharge, no CMT, no bleeding Musculoskeletal:        General: No swelling. Normal range of motion.     Cervical back: Neck supple.  Skin:    General: Skin is warm and dry.  Neurological:     General: No focal deficit present.     Mental Status:  She is alert.  Psychiatric:        Mood and Affect: Mood normal.     ED Results / Procedures / Treatments   Labs (all labs ordered are listed, but only abnormal results are displayed) Labs Reviewed  WET PREP, GENITAL - Abnormal; Notable for the following components:      Result Value   Clue Cells Wet Prep HPF POC PRESENT (*)    WBC, Wet Prep HPF POC MANY (*)    All other components within normal limits  URINALYSIS, ROUTINE W REFLEX MICROSCOPIC - Abnormal; Notable for the following components:   Color, Urine STRAW (*)    APPearance HAZY (*)    Specific Gravity, Urine 1.004 (*)    Hgb urine dipstick SMALL (*)    Leukocytes,Ua LARGE (*)    Bacteria, UA RARE (*)    All other components within normal limits  GC/CHLAMYDIA PROBE AMP ([place]) NOT AT [facility]    EKG None  Radiology No results found.  Procedures Procedures  Medications Ordered in the ED Medications - No data to display   MDM Rules/Calculators/A&P MDM Patient with vaginal discharge and irritation. Will check GC/CT and wet prep and UA.   ED Course  I have reviewed the triage vital signs and the nursing notes.  Pertinent labs & imaging results that were available during my care of the patient were reviewed by me and considered in my medical decision making (see chart for details).  Clinical Course as of 02/25/21 1907  Thu Feb 25, 2021  1806 Wet prep is positive for BV, UA not convincing for UTI as she is not having any dysuria and does not feel like past UTI.  [CS]    Clinical Course User Index [CS] Truddie Hidden, MD    Final Clinical Impression(s) / ED Diagnoses Final diagnoses:  BV (bacterial vaginosis)    Rx / DC Orders ED Discharge Orders          Ordered    metroNIDAZOLE (FLAGYL) 500 MG tablet  2 times daily        02/25/21 1906             Truddie Hidden, MD 02/25/21 1907

## 2021-02-25 NOTE — ED Triage Notes (Addendum)
Pt c/o blood when wiping.  Pt has yellow discharge as well.  Reports that vaginal area is itching and swollen. Pt is [redacted] weeks pregnant.

## 2021-02-25 NOTE — ED Provider Notes (Signed)
Emergency Medicine Provider Triage Evaluation Note  Mackenzie Schultz , a 25 y.o. female  was evaluated in triage.  Pt complains of vaginal itching and irritation. Pt currently pregnant.  Review of Systems  Positive: Vaginal itching Negative: fever  Physical Exam  BP 121/75 (BP Location: Left Arm)   Pulse 84   Temp 98 F (36.7 C) (Oral)   Resp 17   Ht 5\' 4"  (1.626 m)   Wt 81.6 kg   LMP 09/30/2020 Comment: negative HCG blood test 10-28-2020  SpO2 100%   BMI 30.90 kg/m  Gen:   Awake, no distress   Resp:  Normal effort  MSK:   Moves extremities without difficulty  Other:    Medical Decision Making  Medically screening exam initiated at 2:29 PM.  Appropriate orders placed.  Keeghan F Cathers was informed that the remainder of the evaluation will be completed by another provider, this initial triage assessment does not replace that evaluation, and the importance of remaining in the ED until their evaluation is complete.     10-30-2020 02/25/21 1429    13/10/22, MD 02/26/21 [phone]

## 2021-02-26 LAB — GC/CHLAMYDIA PROBE AMP (~~LOC~~) NOT AT ARMC
Chlamydia: NEGATIVE
Comment: NEGATIVE
Comment: NORMAL
Neisseria Gonorrhea: NEGATIVE

## 2021-03-08 ENCOUNTER — Ambulatory Visit (INDEPENDENT_AMBULATORY_CARE_PROVIDER_SITE_OTHER): Payer: Medicaid Other | Admitting: Advanced Practice Midwife

## 2021-03-08 ENCOUNTER — Other Ambulatory Visit: Payer: Self-pay

## 2021-03-08 ENCOUNTER — Encounter: Payer: Self-pay | Admitting: Advanced Practice Midwife

## 2021-03-08 VITALS — BP 114/73 | HR 91 | Wt 190.7 lb

## 2021-03-08 DIAGNOSIS — R011 Cardiac murmur, unspecified: Secondary | ICD-10-CM

## 2021-03-08 DIAGNOSIS — O0990 Supervision of high risk pregnancy, unspecified, unspecified trimester: Secondary | ICD-10-CM

## 2021-03-08 DIAGNOSIS — Z609 Problem related to social environment, unspecified: Secondary | ICD-10-CM

## 2021-03-08 DIAGNOSIS — O10912 Unspecified pre-existing hypertension complicating pregnancy, second trimester: Secondary | ICD-10-CM

## 2021-03-08 DIAGNOSIS — Z3A22 22 weeks gestation of pregnancy: Secondary | ICD-10-CM

## 2021-03-08 NOTE — Progress Notes (Signed)
   PRENATAL VISIT NOTE  Subjective:  Mackenzie Schultz is a 25 y.o. G1P0000 at 85w5dbeing seen today for ongoing prenatal care.  She is currently monitored for the following issues for this high-risk pregnancy and has Supervision of high risk pregnancy, antepartum; Maternal obesity affecting pregnancy, antepartum; Heart murmur; and Chronic hypertension in obstetric context in second trimester on their problem list.  Patient reports no complaints.  Contractions: Not present. Vag. Bleeding: None.  Movement: Present. Denies leaking of fluid.   The following portions of the patient's history were reviewed and updated as appropriate: allergies, current medications, past family history, past medical history, past social history, past surgical history and problem list.   Objective:   Vitals:   03/08/21 1038  BP: 114/73  Pulse: 91  Weight: 190 lb 11.2 oz (86.5 kg)    Fetal Status: Fetal Heart Rate (bpm): 160   Movement: Present     General:  Alert, oriented and cooperative. Patient is in no acute distress.  Skin: Skin is warm and dry. No rash noted.   Cardiovascular: Normal heart rate noted  Respiratory: Normal respiratory effort, no problems with respiration noted  Abdomen: Soft, gravid, appropriate for gestational age.  Pain/Pressure: Absent     Pelvic: Cervical exam deferred        Extremities: Normal range of motion.  Edema: None  Mental Status: Normal mood and affect. Normal behavior. Normal judgment and thought content.   Assessment and Plan:  Pregnancy: G1P0000 at 235w5d. Chronic hypertension in obstetric context in second trimester --baseline labs today --BP wnl without medications  - CBC - Protein / creatinine ratio, urine - Comp Met (CMET)  2. Supervision of high risk pregnancy, antepartum --Anticipatory guidance about next visits/weeks of pregnancy given. --Next visit in 4 weeks for GTT  3. [redacted] weeks gestation of pregnancy   4. Heart murmur --Pt with hx heart  murmur and reports she receives disability related to this condition. She has not had evaluation for the murmur since childhood. Since her mother is deceased, we may not be able to obtain childhood records.  --Will refer to cardiology for basic evaluation.  Pt with occasional SOB and feeling like her heart is racing for a few minutes.  Denies any symptoms today.   - Ambulatory referral to Cardiology  5. High risk social situation --Pt living in apartment and has apartment neighbor that is not clean, leaves food out so there are bugs in her apartment. She will not sleep on her mattress due to bedbugs so she has air mattress on top of the provided mattress.   --Plans to talk with DSS about child support from the baby's father --Has some assistance with [employer] to cover utilities --Pt to talk with AnSeth Bakeoday about resources  Preterm labor symptoms and general obstetric precautions including but not limited to vaginal bleeding, contractions, leaking of fluid and fetal movement were reviewed in detail with the patient. Please refer to After Visit Summary for other counseling recommendations.   No follow-ups on file.  Future Appointments  Date Time Provider [place]11/22/2022  1:30 PM [facility]22  1:45 PM [facility] US6 [facility]  LiFatima BlankCNM

## 2021-03-09 ENCOUNTER — Ambulatory Visit: Payer: Medicaid Other | Attending: Obstetrics and Gynecology

## 2021-03-09 ENCOUNTER — Encounter: Payer: Self-pay | Admitting: *Deleted

## 2021-03-09 ENCOUNTER — Other Ambulatory Visit: Payer: Self-pay | Admitting: *Deleted

## 2021-03-09 ENCOUNTER — Ambulatory Visit: Payer: Medicaid Other | Admitting: *Deleted

## 2021-03-09 VITALS — BP 109/70 | HR 80

## 2021-03-09 DIAGNOSIS — O99210 Obesity complicating pregnancy, unspecified trimester: Secondary | ICD-10-CM | POA: Diagnosis present

## 2021-03-09 DIAGNOSIS — O10012 Pre-existing essential hypertension complicating pregnancy, second trimester: Secondary | ICD-10-CM | POA: Diagnosis not present

## 2021-03-09 DIAGNOSIS — O0990 Supervision of high risk pregnancy, unspecified, unspecified trimester: Secondary | ICD-10-CM

## 2021-03-09 DIAGNOSIS — O10912 Unspecified pre-existing hypertension complicating pregnancy, second trimester: Secondary | ICD-10-CM

## 2021-03-09 DIAGNOSIS — Z3A22 22 weeks gestation of pregnancy: Secondary | ICD-10-CM | POA: Diagnosis not present

## 2021-03-09 LAB — COMPREHENSIVE METABOLIC PANEL
ALT: 45 IU/L — ABNORMAL HIGH (ref 0–32)
AST: 27 IU/L (ref 0–40)
Albumin/Globulin Ratio: 1.4 (ref 1.2–2.2)
Albumin: 4.1 g/dL (ref 3.9–5.0)
Alkaline Phosphatase: 75 IU/L (ref 44–121)
BUN/Creatinine Ratio: 14 (ref 9–23)
BUN: 10 mg/dL (ref 6–20)
Bilirubin Total: <0.2 mg/dL (ref 0.0–1.2)
CO2: 20 mmol/L (ref 20–29)
Calcium: 10.1 mg/dL (ref 8.7–10.2)
Chloride: 104 mmol/L (ref 96–106)
Creatinine, Ser: 0.73 mg/dL (ref 0.57–1.00)
Globulin, Total: 3 g/dL (ref 1.5–4.5)
Glucose: 74 mg/dL (ref 70–99)
Potassium: 4.7 mmol/L (ref 3.5–5.2)
Sodium: 136 mmol/L (ref 134–144)
Total Protein: 7.1 g/dL (ref 6.0–8.5)
eGFR: 116 mL/min/{1.73_m2} (ref >59)

## 2021-03-09 LAB — PROTEIN / CREATININE RATIO, URINE
Creatinine, Urine: 90.2 mg/dL
Protein, Ur: 7.8 mg/dL
Protein/Creat Ratio: 86 mg/g creat (ref 0–200)

## 2021-03-09 LAB — CBC
Hematocrit: 36.6 % (ref 34.0–46.6)
Hemoglobin: 12 g/dL (ref 11.1–15.9)
MCH: 29.2 pg (ref 26.6–33.0)
MCHC: 32.8 g/dL (ref 31.5–35.7)
MCV: 89 fL (ref 79–97)
Platelets: 283 10*3/uL (ref 150–450)
RBC: 4.11 x10E6/uL (ref 3.77–5.28)
RDW: 12.2 % (ref 11.7–15.4)
WBC: 11.7 10*3/uL — ABNORMAL HIGH (ref 3.4–10.8)

## 2021-03-31 ENCOUNTER — Ambulatory Visit: Payer: Medicaid Other | Admitting: Cardiology

## 2021-04-05 ENCOUNTER — Other Ambulatory Visit: Payer: Medicaid Other

## 2021-04-05 ENCOUNTER — Other Ambulatory Visit: Payer: Self-pay

## 2021-04-05 ENCOUNTER — Ambulatory Visit (INDEPENDENT_AMBULATORY_CARE_PROVIDER_SITE_OTHER): Payer: Medicaid Other | Admitting: Nurse Practitioner

## 2021-04-05 ENCOUNTER — Encounter: Payer: Self-pay | Admitting: Nurse Practitioner

## 2021-04-05 VITALS — BP 122/77 | HR 80 | Wt 190.0 lb

## 2021-04-05 DIAGNOSIS — Z23 Encounter for immunization: Secondary | ICD-10-CM

## 2021-04-05 DIAGNOSIS — R011 Cardiac murmur, unspecified: Secondary | ICD-10-CM

## 2021-04-05 DIAGNOSIS — O0992 Supervision of high risk pregnancy, unspecified, second trimester: Secondary | ICD-10-CM

## 2021-04-05 DIAGNOSIS — O10912 Unspecified pre-existing hypertension complicating pregnancy, second trimester: Secondary | ICD-10-CM

## 2021-04-05 DIAGNOSIS — R630 Anorexia: Secondary | ICD-10-CM

## 2021-04-05 DIAGNOSIS — O0990 Supervision of high risk pregnancy, unspecified, unspecified trimester: Secondary | ICD-10-CM

## 2021-04-05 DIAGNOSIS — Z659 Problem related to unspecified psychosocial circumstances: Secondary | ICD-10-CM

## 2021-04-05 DIAGNOSIS — Z3A28 28 weeks gestation of pregnancy: Secondary | ICD-10-CM

## 2021-04-05 MED ORDER — IPRATROPIUM-ALBUTEROL 20-100 MCG/ACT IN AERS: 1.0000 | INHALATION_SPRAY | Freq: Four times a day (QID) | RESPIRATORY_TRACT | 3 refills | Status: DC | PRN

## 2021-04-05 MED ORDER — VITAFOL GUMMIES 3.33-0.333-34.8 MG PO CHEW: 3.0000 | CHEWABLE_TABLET | Freq: Every day | ORAL | 11 refills | Status: DC

## 2021-04-05 NOTE — Progress Notes (Signed)
ROB, arrived too late to start 2 Hr. GTT.  TDAP vaccine given in RD, tolerated well.

## 2021-04-05 NOTE — Progress Notes (Signed)
2 gtt labs r/s'd for Thursday. Tdap offered; pt undecided. Has reading material

## 2021-04-05 NOTE — Progress Notes (Signed)
° ° °  Subjective:  Mackenzie Schultz is a 25 y.o. G1P0000 at [redacted]w[redacted]d being seen today for ongoing prenatal care.  She is currently monitored for the following issues for this high-risk pregnancy and has Supervision of high risk pregnancy, antepartum; Maternal obesity affecting pregnancy, antepartum; Heart murmur; and Chronic hypertension in obstetric context in second trimester on their problem list.  Patient reports no complaints.  Contractions: Not present. Vag. Bleeding: None.  Movement: Present. Denies leaking of fluid.   The following portions of the patient's history were reviewed and updated as appropriate: allergies, current medications, past family history, past medical history, past social history, past surgical history and problem list. Problem list updated.  Objective:   Vitals:   04/05/21 1037  BP: 122/77  Pulse: 80  Weight: 190 lb (86.2 kg)    Fetal Status: Fetal Heart Rate (bpm): 160 Fundal Height: 30 cm Movement: Present     General:  Alert, oriented and cooperative. Patient is in no acute distress.  Skin: Skin is warm and dry. No rash noted.   Cardiovascular: Normal heart rate noted  Respiratory: Normal respiratory effort, no problems with respiration noted  Abdomen: Soft, gravid, appropriate for gestational age. Pain/Pressure: Absent     Pelvic:  Cervical exam deferred        Extremities: Normal range of motion.  Edema: None  Mental Status: Normal mood and affect. Normal behavior. Normal judgment and thought content.   Urinalysis:      Assessment and Plan:  Pregnancy: G1P0000 at [redacted]w[redacted]d  1. Supervision of high risk pregnancy, antepartum Has ultrasound today Has asthma - has not needed inhaler but is out of ProAir - on refill, no longer covered by Medicaid.  Changed to covered inhaler. Missed glucola today Took TDAP today  2. Chronic hypertension in obstetric context in second trimester BP today is normal  3. Heart murmur Missed appt due to transportation  Problems Message sent to office scheduler for referrals - can she see cardiology at [facility]?  4. Decreased appetite Thinks her PNV is causing this.  Will change PNV to gummie to see if that will be helpful and so she can take it and still  have an appetite.  5. Other social stressor Housing has been a problem for her - reported bed bugs at last appt Has difficulty with transportation also - missed cardiology appt due to transportation May need to see pregnancy navigator also  - Ambulatory referral to Integrated Behavioral Health  6. [redacted] weeks gestation of pregnancy   Preterm labor symptoms and general obstetric precautions including but not limited to vaginal bleeding, contractions, leaking of fluid and fetal movement were reviewed in detail with the patient. Please refer to After Visit Summary for other counseling recommendations.  Return in about 2 weeks (around 04/19/2021) for in person ROB.  Nolene Bernheim, RN, MSN, NP-BC Nurse Practitioner, [facility] for [employer], [facility] 04/05/2021 11:06 AM

## 2021-04-06 ENCOUNTER — Ambulatory Visit: Payer: Medicaid Other | Attending: Obstetrics and Gynecology

## 2021-04-06 ENCOUNTER — Ambulatory Visit: Payer: Medicaid Other | Admitting: *Deleted

## 2021-04-06 ENCOUNTER — Other Ambulatory Visit: Payer: Self-pay | Admitting: *Deleted

## 2021-04-06 VITALS — BP 109/67 | HR 86

## 2021-04-06 DIAGNOSIS — O0990 Supervision of high risk pregnancy, unspecified, unspecified trimester: Secondary | ICD-10-CM | POA: Insufficient documentation

## 2021-04-06 DIAGNOSIS — O99210 Obesity complicating pregnancy, unspecified trimester: Secondary | ICD-10-CM | POA: Diagnosis present

## 2021-04-06 DIAGNOSIS — O10912 Unspecified pre-existing hypertension complicating pregnancy, second trimester: Secondary | ICD-10-CM | POA: Insufficient documentation

## 2021-04-06 DIAGNOSIS — O10012 Pre-existing essential hypertension complicating pregnancy, second trimester: Secondary | ICD-10-CM

## 2021-04-06 DIAGNOSIS — Z3A26 26 weeks gestation of pregnancy: Secondary | ICD-10-CM | POA: Diagnosis not present

## 2021-04-06 DIAGNOSIS — O35EXX0 Maternal care for other (suspected) fetal abnormality and damage, fetal genitourinary anomalies, not applicable or unspecified: Secondary | ICD-10-CM

## 2021-04-08 ENCOUNTER — Other Ambulatory Visit: Payer: Self-pay

## 2021-04-08 ENCOUNTER — Other Ambulatory Visit: Payer: Medicaid Other

## 2021-04-08 DIAGNOSIS — Z3A27 27 weeks gestation of pregnancy: Secondary | ICD-10-CM

## 2021-04-08 DIAGNOSIS — O0990 Supervision of high risk pregnancy, unspecified, unspecified trimester: Secondary | ICD-10-CM

## 2021-04-09 LAB — CBC
Hematocrit: 31.8 % — ABNORMAL LOW (ref 34.0–46.6)
Hemoglobin: 10.9 g/dL — ABNORMAL LOW (ref 11.1–15.9)
MCH: 29.9 pg (ref 26.6–33.0)
MCHC: 34.3 g/dL (ref 31.5–35.7)
MCV: 87 fL (ref 79–97)
Platelets: 268 10*3/uL (ref 150–450)
RBC: 3.65 x10E6/uL — ABNORMAL LOW (ref 3.77–5.28)
RDW: 11.8 % (ref 11.7–15.4)
WBC: 11.1 10*3/uL — ABNORMAL HIGH (ref 3.4–10.8)

## 2021-04-09 LAB — GLUCOSE TOLERANCE, 2 HOURS W/ 1HR
Glucose, 1 hour: 98 mg/dL (ref 70–179)
Glucose, 2 hour: 95 mg/dL (ref 70–152)
Glucose, Fasting: 78 mg/dL (ref 70–91)

## 2021-04-09 LAB — HIV ANTIBODY (ROUTINE TESTING W REFLEX): HIV Screen 4th Generation wRfx: NONREACTIVE

## 2021-04-09 LAB — RPR: RPR Ser Ql: NONREACTIVE

## 2021-04-16 ENCOUNTER — Ambulatory Visit (INDEPENDENT_AMBULATORY_CARE_PROVIDER_SITE_OTHER): Payer: Medicaid Other | Admitting: Licensed Clinical Social Worker

## 2021-04-16 DIAGNOSIS — Z609 Problem related to social environment, unspecified: Secondary | ICD-10-CM

## 2021-04-16 DIAGNOSIS — F4321 Adjustment disorder with depressed mood: Secondary | ICD-10-CM | POA: Diagnosis not present

## 2021-04-21 ENCOUNTER — Ambulatory Visit (INDEPENDENT_AMBULATORY_CARE_PROVIDER_SITE_OTHER): Payer: Medicaid Other | Admitting: Women's Health

## 2021-04-21 ENCOUNTER — Encounter: Payer: Self-pay | Admitting: *Deleted

## 2021-04-21 ENCOUNTER — Other Ambulatory Visit: Payer: Self-pay

## 2021-04-21 VITALS — BP 125/72 | HR 86 | Wt 192.6 lb

## 2021-04-21 DIAGNOSIS — O10912 Unspecified pre-existing hypertension complicating pregnancy, second trimester: Secondary | ICD-10-CM

## 2021-04-21 DIAGNOSIS — R011 Cardiac murmur, unspecified: Secondary | ICD-10-CM

## 2021-04-21 DIAGNOSIS — Z3A29 29 weeks gestation of pregnancy: Secondary | ICD-10-CM

## 2021-04-21 DIAGNOSIS — O99210 Obesity complicating pregnancy, unspecified trimester: Secondary | ICD-10-CM

## 2021-04-21 DIAGNOSIS — O283 Abnormal ultrasonic finding on antenatal screening of mother: Secondary | ICD-10-CM | POA: Insufficient documentation

## 2021-04-21 DIAGNOSIS — R7401 Elevation of levels of liver transaminase levels: Secondary | ICD-10-CM | POA: Insufficient documentation

## 2021-04-21 DIAGNOSIS — O0990 Supervision of high risk pregnancy, unspecified, unspecified trimester: Secondary | ICD-10-CM

## 2021-04-21 NOTE — Patient Instructions (Signed)
Maternity Assessment Unit (MAU) ° °The Maternity Assessment Unit (MAU) is located at the [facility] at [facility]. The address is: [address], [address], [place], [place] [address]. Please see map below for additional directions. ° ° ° °The Maternity Assessment Unit is designed to help you during your pregnancy, and for up to 6 weeks after delivery, with any pregnancy- or postpartum-related emergencies, if you think you are in labor, or if your water has broken. For example, if you experience nausea and vomiting, vaginal bleeding, severe abdominal or pelvic pain, elevated blood pressure or other problems related to your pregnancy or postpartum time, please come to the Maternity Assessment Unit for assistance. ° ° ° ° ° ° °AREA PEDIATRIC/FAMILY PRACTICE PHYSICIANS ° °[facility] °[address] °[address] °[place], [place] [address] °Phone - [phone]   Fax - [phone] ° °[facility] °[address] °[place], [place]   [address] °Phone - [phone]   Fax - [phone] ° °[facility] °[address] °[place], [place]  [address] °Phone - [phone]   Fax - [phone] ° ° [facility] °[address] °[place], [place]  [address] °Phone - [phone]   Fax - [phone] ° °[facility] °[address] °[place], [place]  [address] °Phone - [phone]   Fax - [phone] ° °[facility] °[address] °[place], [place]  [address] °Phone - [phone]   Fax - [phone] ° °[facility] °[address] °[place], [place]  [address] °Phone - [phone]   Fax - [phone] ° °[facility] °[address] °[place], [place]  [address] °Phone - [phone]   Fax - [phone] ° °[facility] °[address] °[place], [place]  [address] °Phone - [phone]   Fax -  [phone] °[facility] °[address] °[place], [place]  [address] °Phone - [phone]   Fax - [phone] ° °[facility] °[address] °[place], [place]  [address] °Phone - [phone]   Fax - [phone] ° °[facility] °[address] °[place], [place]  [address] °Phone - [phone]   Fax - [phone] ° °[facility] °[address] °[place], [place]  [address] °Phone - [phone]   Fax - [phone] ° °[facility] °[address] °[place], [place]  [address] °Phone - [phone] ° °[facility] °[address] °[place], [place]  [address] °Phone - [phone]   Fax - [phone] ° °[facility] °[address] °[place], [place]  [address] °Phone - [phone]   Fax - [phone] ° °[facility] °[address] °[place], [place]  [address] °Phone - [phone]   Fax - [phone] ° °[facility] °[address] °[place], [place]  [address] °Phone - [phone]   Fax - [phone] ° ° °[facility] °[address] °[place], [place]  [address] °Phone - [phone]   Fax - [phone] ° °[facility] °[address] °[place], [place]  [address] °Phone - [phone]   Fax - [phone] ° °[facility] °[address] °[place],   [address] °Phone - [phone]   Fax - [phone] ° °[facility] °[address] °,   [address] °Phone - [phone]     Fax - [phone] ° °[facility] °[address] °[place], [place]  [address] °Phone - [phone]   Fax - [phone] °[facility] °[address] °[place], [place]  [address] °Phone - [phone]   Fax - [phone] ° °[facility] °[address] °[place], [place]  [address] °Phone - [phone]   Fax - [phone] ° °[facility] °[address] °[place], [place]  [address] °Phone - [phone]   Fax - [phone] ° ° ° ° ° ° ° ° °

## 2021-04-21 NOTE — BH Specialist Note (Signed)
Integrated Behavioral Health via Telemedicine Visit  04/21/2021 Mackenzie Schultz [id number]  Number of Integrated Behavioral Health visits: 1 Session Start time: 10:00am  Session End time: 10:27am Total time: 27 mins via phone due to connection error message   Referring Provider: Lilyan Punt NP Patient/Family location: Home [facility] Provider location: [facility]  All persons participating in visit: Pt A Mackenzie Schultz and LCSW A Rihana Kiddy  Types of Service: Individual psychotherapy and Telephone visit  I connected with Mackenzie Schultz and/or Mackenzie Schultz's n/a via  Telephone or Video Enabled Telemedicine Application  (Video is Caregility application) and verified that I am speaking with the correct person using two identifiers. Discussed confidentiality: Yes   I discussed the limitations of telemedicine and the availability of in person appointments.  Discussed there is a possibility of technology failure and discussed alternative modes of communication if that failure occurs.  I discussed that engaging in this telemedicine visit, they consent to the provision of behavioral healthcare and the services will be billed under their insurance.  Patient and/or legal guardian expressed understanding and consented to Telemedicine visit: Yes   Presenting Concerns: Patient and/or family reports the following symptoms/concerns: depressed mood and situational stress  Duration of problem: over 6 months; Severity of problem: mild  Patient and/or Family's Strengths/Protective Factors: Social connections  Goals Addressed: Patient will:  Reduce symptoms of: depression and stress   Increase knowledge and/or ability of: coping skills and stress reduction   Demonstrate ability to: Increase healthy adjustment to current life circumstances and Increase adequate support systems for patient/family  Progress towards Goals: Ongoing  Interventions: Interventions utilized:  Supportive Counseling Standardized  Assessments completed: PHQ 9   Assessment: Patient currently experiencing adjustment disorder with depressed mood and situational stress.   Patient may benefit from integrated behavioral health.  Plan: Follow up with behavioral health clinician on : 05/03/2021 Behavioral recommendations: collaborate with community resources to obtain safe apartment, prioritize rest, take prenatal vitamins, and stress reducing activity  Referral(s): Integrated [employer] (In Clinic)  I discussed the assessment and treatment plan with the patient and/or parent/guardian. They were provided an opportunity to ask questions and all were answered. They agreed with the plan and demonstrated an understanding of the instructions.   They were advised to call back or seek an in-person evaluation if the symptoms worsen or if the condition fails to improve as anticipated.  Gwyndolyn Saxon, LCSW

## 2021-04-21 NOTE — Progress Notes (Signed)
Subjective:  Mackenzie Schultz is a 26 y.o. G1P0000 at 37w0dbeing seen today for ongoing prenatal care.  She is currently monitored for the following issues for this high-risk pregnancy and has Supervision of high risk pregnancy, antepartum; Maternal obesity affecting pregnancy, antepartum; Heart murmur; Chronic hypertension in obstetric context in second trimester; Elevated ALT measurement; and Abnormal fetal ultrasound on their problem list.  Patient reports no complaints.  Contractions: Not present. Vag. Bleeding: None.  Movement: Present. Denies leaking of fluid.   The following portions of the patient's history were reviewed and updated as appropriate: allergies, current medications, past family history, past medical history, past social history, past surgical history and problem list. Problem list updated.  Objective:   Vitals:   04/21/21 1617  BP: 125/72  Pulse: 86  Weight: 192 lb 9.6 oz (87.4 kg)    Fetal Status: Fetal Heart Rate (bpm): 154   Movement: Present     General:  Alert, oriented and cooperative. Patient is in no acute distress.  Skin: Skin is warm and dry. No rash noted.   Cardiovascular: Normal heart rate noted  Respiratory: Normal respiratory effort, no problems with respiration noted  Abdomen: Soft, gravid, appropriate for gestational age. Pain/Pressure: Absent     Pelvic: Vag. Bleeding: None     Cervical exam deferred        Extremities: Normal range of motion.  Edema: None  Mental Status: Normal mood and affect. Normal behavior. Normal judgment and thought content.   Urinalysis:      Assessment and Plan:  Pregnancy: G1P0000 at 236w0d1. Supervision of high risk pregnancy, antepartum -peds list given  PHQ9 SCORE ONLY 04/21/2021 04/08/2021 12/23/2020  PHQ-9 Total Score 0 1 1   GAD 7 : Generalized Anxiety Score 04/21/2021 04/08/2021 12/23/2020  Nervous, Anxious, on Edge 0 0 0  Control/stop worrying 0 0 0  Worry too much - different things 0 0 0  Trouble  relaxing 0 0 0  Restless 0 0 0  Easily annoyed or irritable 0 0 0  Afraid - awful might happen 0 0 0  Total GAD 7 Score 0 0 0   2. Maternal obesity affecting pregnancy, antepartum  3. Chronic hypertension in obstetric context in second trimester -no meds -BP today 125/72 -next growth scheduled 05/03/2021  4. Heart murmur -OB Cardio visit scheduled 05/28/2021  5. [redacted] weeks gestation of pregnancy  6. Elevated ALT measurement - not repeated with 28 week labs, repeating today - Comp Met (CMET)  Preterm labor symptoms and general obstetric precautions including but not limited to vaginal bleeding, contractions, leaking of fluid and fetal movement were reviewed in detail with the patient. I discussed the assessment and treatment plan with the patient. The patient was provided an opportunity to ask questions and all were answered. The patient agreed with the plan and demonstrated an understanding of the instructions. The patient was advised to call back or seek an in-person office evaluation/[facility] any urgent or concerning symptoms. Please refer to After Visit Summary for other counseling recommendations.  Return in about 2 weeks (around 05/05/2021) for in-person HOB/APP OK.   Mallory Schaad, NiGerrie NordmannNP

## 2021-04-22 LAB — COMPREHENSIVE METABOLIC PANEL
ALT: 18 IU/L (ref 0–32)
AST: 19 IU/L (ref 0–40)
Albumin/Globulin Ratio: 1.8 (ref 1.2–2.2)
Albumin: 4.1 g/dL (ref 3.9–5.0)
Alkaline Phosphatase: 76 IU/L (ref 44–121)
BUN/Creatinine Ratio: 17 (ref 9–23)
BUN: 12 mg/dL (ref 6–20)
Bilirubin Total: <0.2 mg/dL (ref 0.0–1.2)
CO2: 19 mmol/L — ABNORMAL LOW (ref 20–29)
Calcium: 9.3 mg/dL (ref 8.7–10.2)
Chloride: 100 mmol/L (ref 96–106)
Creatinine, Ser: 0.69 mg/dL (ref 0.57–1.00)
Globulin, Total: 2.3 g/dL (ref 1.5–4.5)
Glucose: 81 mg/dL (ref 70–99)
Potassium: 3.9 mmol/L (ref 3.5–5.2)
Sodium: 140 mmol/L (ref 134–144)
Total Protein: 6.4 g/dL (ref 6.0–8.5)
eGFR: 122 mL/min/{1.73_m2} (ref >59)

## 2021-04-24 ENCOUNTER — Other Ambulatory Visit: Payer: Self-pay

## 2021-04-24 ENCOUNTER — Encounter (HOSPITAL_COMMUNITY): Payer: Self-pay | Admitting: Family Medicine

## 2021-04-24 ENCOUNTER — Inpatient Hospital Stay (HOSPITAL_COMMUNITY)
Admission: AD | Admit: 2021-04-24 | Discharge: 2021-04-24 | Disposition: A | Discharge status: Home / Self Care | Payer: Medicaid Other | Attending: Family Medicine | Admitting: Family Medicine

## 2021-04-24 DIAGNOSIS — Z3A29 29 weeks gestation of pregnancy: Secondary | ICD-10-CM

## 2021-04-24 DIAGNOSIS — O99891 Other specified diseases and conditions complicating pregnancy: Secondary | ICD-10-CM

## 2021-04-24 DIAGNOSIS — O26893 Other specified pregnancy related conditions, third trimester: Secondary | ICD-10-CM | POA: Insufficient documentation

## 2021-04-24 DIAGNOSIS — M549 Dorsalgia, unspecified: Secondary | ICD-10-CM | POA: Diagnosis not present

## 2021-04-24 DIAGNOSIS — Z3689 Encounter for other specified antenatal screening: Secondary | ICD-10-CM | POA: Insufficient documentation

## 2021-04-24 DIAGNOSIS — Z87891 Personal history of nicotine dependence: Secondary | ICD-10-CM | POA: Diagnosis not present

## 2021-04-24 DIAGNOSIS — M545 Low back pain, unspecified: Secondary | ICD-10-CM | POA: Insufficient documentation

## 2021-04-24 LAB — URINALYSIS, MICROSCOPIC (REFLEX)

## 2021-04-24 LAB — URINALYSIS, ROUTINE W REFLEX MICROSCOPIC
Bilirubin Urine: NEGATIVE
Glucose, UA: NEGATIVE mg/dL
Ketones, ur: NEGATIVE mg/dL
Leukocytes,Ua: NEGATIVE
Nitrite: NEGATIVE
Protein, ur: NEGATIVE mg/dL
Specific Gravity, Urine: 1.02 (ref 1.005–1.030)
pH: 6.5 (ref 5.0–8.0)

## 2021-04-24 MED ORDER — CYCLOBENZAPRINE HCL 5 MG PO TABS
5.0000 mg | ORAL_TABLET | Freq: Once | ORAL | Status: AC
  Administered 2021-04-24: 5 mg via ORAL
  Filled 2021-04-24: qty 1

## 2021-04-24 NOTE — MAU Note (Addendum)
..  Mackenzie Schultz is a 26 y.o. at [redacted]w[redacted]d here in MAU reporting: intermittent lower back pain that started about 45 mins ago. Pt was sitting down watching a movie when pain suddenly began but has decreased in intensity. Arrived via EMS. Pt denies dysuria,  DFM, VB, PIH s/s, and LOF.  Onset of complaint: 145 Pain score: 7/10 Vitals:   04/24/21 0234  BP: (!) 137/93  Pulse: (!) 106  Resp: 18  SpO2: 100%     FHT:140 Lab orders placed from triage:  UA

## 2021-04-24 NOTE — MAU Provider Note (Signed)
History     CSN: [id number]  Arrival date and time: 04/24/21 0231   Event Date/Time   First Provider Initiated Contact with Patient 04/24/21 0308      Chief Complaint  Patient presents with   Back Pain   Mackenzie Schultz is a 26 y.o. G1P0 at 56w3dwho receives care at [facility].  She presents  today for Back Pain. She states the pain is located in her lower back "at the top part of my butt cheeks."  She reports it is intermittent in nature and started while she was laying down about 45 minutes prior to arrival.  She denies relieving or aggravating factors for the pain.  She rates her pain a 8/10 at onset and was 5/10 upon ambulance arrival and is now a 2/10.  She endorses fetal movement and denies vaginal bleeding, discharge, or leaking.  She also denies abdominal cramping or contractions.     OB History     Gravida  1   Para  0   Term  0   Preterm  0   AB  0   Living  0      SAB  0   IAB  0   Ectopic  0   Multiple  0   Live Births  0           Past Medical History:  Diagnosis Date   Asthma    Heart murmur    Hypertension    Panic attacks     History reviewed. No pertinent surgical history.  Family History  Problem Relation Age of Onset   Hypertension Mother    Hypertension Father    Hypertension Other     Social History   Tobacco Use   Smoking status: Former    Types: Cigars   Smokeless tobacco: Never  Vaping Use   Vaping Use: Former  Substance Use Topics   Alcohol use: Not Currently    Comment: Occasional, not since confirmed pregnancy   Drug use: No    Allergies:  Allergies  Allergen Reactions   Ibuprofen Other (See Comments)    "it just won't stay down makes me feel sick and makes my stomach burn"   Mushroom Extract Complex Other (See Comments)    "her body mood just changes"    Medications Prior to Admission  Medication Sig Dispense Refill Last Dose   aspirin EC 81 MG tablet Take 1 tablet (81 mg total) by mouth daily.  Take after 12 weeks for prevention of preeclampsia later in pregnancy (Patient not taking: Reported on 04/06/2021) 300 tablet 2    Blood Pressure Monitoring (BLOOD PRESSURE KIT) DEVI 1 kit by Does not apply route once a week. 1 each 0    Ipratropium-Albuterol (COMBIVENT) 20-100 MCG/ACT AERS respimat Inhale 1 puff into the lungs every 6 (six) hours as needed for wheezing or shortness of breath. 1 each 3    Prenatal Vit-Fe Phos-FA-Omega (VITAFOL GUMMIES) 3.33-0.333-34.8 MG CHEW Chew 3 each by mouth daily. 90 tablet 11     Review of Systems  Gastrointestinal:  Negative for abdominal pain, constipation, diarrhea, nausea and vomiting.  Genitourinary:  Negative for difficulty urinating, dysuria, vaginal bleeding and vaginal discharge.  Musculoskeletal:  Positive for back pain (Lower back).  Physical Exam   Blood pressure 113/78, pulse 96, resp. rate 18, last menstrual period 09/30/2020, SpO2 99 %.  Physical Exam Vitals reviewed. Exam conducted with a chaperone present.  Constitutional:      Appearance: Normal  appearance.  HENT:     Head: Normocephalic and atraumatic.  Eyes:     Conjunctiva/sclera: Conjunctivae normal.  Cardiovascular:     Rate and Rhythm: Normal rate.  Pulmonary:     Effort: Pulmonary effort is normal. No respiratory distress.  Abdominal:     Tenderness: There is no abdominal tenderness.     Comments: Gravid  Genitourinary:    Comments: Dilation: Closed Effacement (%): Thick Cervical Position: Posterior Exam by:: Gavin Pound, cnm  Musculoskeletal:        General: Normal range of motion.     Cervical back: Normal range of motion.  Skin:    General: Skin is warm and dry.  Neurological:     Mental Status: She is alert and oriented to person, place, and time.  Psychiatric:        Mood and Affect: Mood normal.        Behavior: Behavior normal.        Thought Content: Thought content normal.    Fetal Assessment 145 bpm, Mod Var, -Decels, +Accels Toco: No Ctx  Graphed  MAU Course   Results for orders placed or performed during the hospital encounter of 04/24/21 (from the past 24 hour(s))  Urinalysis, Routine w reflex microscopic Urine, Clean Catch     Status: Abnormal   Collection Time: 04/24/21  2:48 AM  Result Value Ref Range   Color, Urine YELLOW YELLOW   APPearance HAZY (A) CLEAR   Specific Gravity, Urine 1.020 1.005 - 1.030   pH 6.5 5.0 - 8.0   Glucose, UA NEGATIVE NEGATIVE mg/dL   Hgb urine dipstick TRACE (A) NEGATIVE   Bilirubin Urine NEGATIVE NEGATIVE   Ketones, ur NEGATIVE NEGATIVE mg/dL   Protein, ur NEGATIVE NEGATIVE mg/dL   Nitrite NEGATIVE NEGATIVE   Leukocytes,Ua NEGATIVE NEGATIVE  Urinalysis, Microscopic (reflex)     Status: Abnormal   Collection Time: 04/24/21  2:48 AM  Result Value Ref Range   RBC / HPF 0-5 0 - 5 RBC/hpf   WBC, UA 0-5 0 - 5 WBC/hpf   Bacteria, UA RARE (A) NONE SEEN   Squamous Epithelial / LPF 0-5 0 - 5   Mucus PRESENT    No results found.  MDM PE Labs: UA EFM Muscle Relaxant Heat Assessment and Plan  26 year old G1P0  SIUP at 29.3 weeks Cat I FT Back Pain  -POC Reviewed. -Patient declines pelvic exam and cultures.  -However, patient agreeable to cervical exam.  -Exam findings discussed. -Reviewed anticipated back pain in pregnancy related to physiological changes of pregnancy. -Encouraged rest when possible and relief measures given. -Patient offered and accepts muscle relaxant. -Will give flexeril 71m as well as heating pad. -Will monitor and reassess.  -NST Reactive   JMaryann ConnersMSN, CNM 04/24/2021, 3:08 AM   Reassessment (3:51 AM) -Nurse reports patient without pain. -Precautions given. -Information provided in AVS. -Patient to follow up as scheduled.  -Encouraged to call primary office or return to MAU if symptoms worsen or with the onset of new symptoms. -Discharged to home in improved condition.  JMaryann ConnersMSN, CNM Advanced Practice Provider, [facility]

## 2021-05-03 ENCOUNTER — Ambulatory Visit: Payer: Medicaid Other | Attending: Obstetrics and Gynecology

## 2021-05-03 ENCOUNTER — Ambulatory Visit: Payer: Medicaid Other | Admitting: *Deleted

## 2021-05-03 ENCOUNTER — Other Ambulatory Visit: Payer: Self-pay

## 2021-05-03 VITALS — BP 119/74 | HR 93

## 2021-05-03 DIAGNOSIS — O10912 Unspecified pre-existing hypertension complicating pregnancy, second trimester: Secondary | ICD-10-CM | POA: Diagnosis present

## 2021-05-03 DIAGNOSIS — O283 Abnormal ultrasonic finding on antenatal screening of mother: Secondary | ICD-10-CM | POA: Diagnosis present

## 2021-05-03 DIAGNOSIS — O35EXX0 Maternal care for other (suspected) fetal abnormality and damage, fetal genitourinary anomalies, not applicable or unspecified: Secondary | ICD-10-CM | POA: Diagnosis present

## 2021-05-03 DIAGNOSIS — Z3A30 30 weeks gestation of pregnancy: Secondary | ICD-10-CM

## 2021-05-03 DIAGNOSIS — O99210 Obesity complicating pregnancy, unspecified trimester: Secondary | ICD-10-CM | POA: Insufficient documentation

## 2021-05-03 DIAGNOSIS — O0990 Supervision of high risk pregnancy, unspecified, unspecified trimester: Secondary | ICD-10-CM | POA: Insufficient documentation

## 2021-05-03 DIAGNOSIS — O10012 Pre-existing essential hypertension complicating pregnancy, second trimester: Secondary | ICD-10-CM

## 2021-05-04 ENCOUNTER — Other Ambulatory Visit: Payer: Self-pay | Admitting: *Deleted

## 2021-05-04 DIAGNOSIS — O10919 Unspecified pre-existing hypertension complicating pregnancy, unspecified trimester: Secondary | ICD-10-CM

## 2021-05-05 ENCOUNTER — Encounter: Payer: Self-pay | Admitting: Family Medicine

## 2021-05-05 ENCOUNTER — Other Ambulatory Visit: Payer: Self-pay

## 2021-05-05 ENCOUNTER — Ambulatory Visit (INDEPENDENT_AMBULATORY_CARE_PROVIDER_SITE_OTHER): Payer: Medicaid Other | Admitting: Family Medicine

## 2021-05-05 VITALS — BP 119/75 | HR 92 | Wt 192.0 lb

## 2021-05-05 DIAGNOSIS — R110 Nausea: Secondary | ICD-10-CM

## 2021-05-05 DIAGNOSIS — O10912 Unspecified pre-existing hypertension complicating pregnancy, second trimester: Secondary | ICD-10-CM

## 2021-05-05 DIAGNOSIS — O0990 Supervision of high risk pregnancy, unspecified, unspecified trimester: Secondary | ICD-10-CM

## 2021-05-05 DIAGNOSIS — Z3A31 31 weeks gestation of pregnancy: Secondary | ICD-10-CM

## 2021-05-05 DIAGNOSIS — O26899 Other specified pregnancy related conditions, unspecified trimester: Secondary | ICD-10-CM

## 2021-05-05 DIAGNOSIS — O99210 Obesity complicating pregnancy, unspecified trimester: Secondary | ICD-10-CM

## 2021-05-05 MED ORDER — ASPIRIN EC 81 MG PO TBEC: 81.0000 mg | DELAYED_RELEASE_TABLET | Freq: Every day | ORAL | 2 refills | Status: DC

## 2021-05-05 MED ORDER — PROMETHAZINE HCL 12.5 MG PO TABS: 12.5000 mg | ORAL_TABLET | Freq: Three times a day (TID) | ORAL | 0 refills | Status: DC | PRN

## 2021-05-05 NOTE — Progress Notes (Signed)
° °  PRENATAL VISIT NOTE  Subjective:  Mackenzie Schultz is a 26 y.o. G1P0000 at [redacted]w[redacted]d being seen today for ongoing prenatal care.  She is currently monitored for the following issues for this high-risk pregnancy and has Supervision of high risk pregnancy, antepartum; Maternal obesity affecting pregnancy, antepartum; Heart murmur; Chronic hypertension in obstetric context in second trimester; Elevated ALT measurement; and Abnormal fetal ultrasound on their problem list.  Patient reports no complaints.  Contractions: Not present. Vag. Bleeding: None.  Movement: Present. Denies leaking of fluid.   Requesting refill of baby aspirin and nausea medicine. Excited for baby to get here. No other concerns today. Denies headache, vision changes, LE edema, or RUQ pain.   The following portions of the patient's history were reviewed and updated as appropriate: allergies, current medications, past family history, past medical history, past social history, past surgical history and problem list.   Objective:   Vitals:   05/05/21 1551  BP: 119/75  Pulse: 92  Weight: 192 lb (87.1 kg)    Fetal Status: Fetal Heart Rate (bpm): 141 Fundal Height: 31 cm Movement: Present  General:  Alert, oriented and cooperative. Patient is in no acute distress.  Skin: Skin is warm and dry. No rash noted.   Cardiovascular: Normal heart rate noted.  Respiratory: Normal respiratory effort, no problems with respiration noted.  Abdomen: Soft, gravid, appropriate for gestational age.       Pelvic: Cervical exam deferred.  Extremities: Normal range of motion.  Edema: Trace.  Mental Status: Normal mood and affect. Normal behavior. Normal judgment and thought content.   Assessment and Plan:  Pregnancy: G1P0000 at [redacted]w[redacted]d  1. Supervision of high risk pregnancy, antepartum 2. [redacted] weeks gestation of pregnancy Progressing well. FH and FHT within normal range. Taking prenatal vitamins. No concerns. Follow up for next OB visit in 2  weeks.   3. Chronic hypertension in obstetric context in second trimester Stable, BP at goal. Refilled ASA for patient today. Remains asymptomatic. Last growth [place] normal with normal anatomy. Will continue to monitor.  - aspirin EC 81 MG tablet; Take 1 tablet (81 mg total) by mouth daily. Take after 12 weeks for prevention of preeclampsia later in pregnancy.  Dispense: 30 tablet; Refill: 2  4. Maternal obesity affecting pregnancy, antepartum  5. Pregnancy related nausea, antepartum Refilled per patient request.  - promethazine (PHENERGAN) 12.5 MG tablet; Take 1 tablet (12.5 mg total) by mouth every 8 (eight) hours as needed for nausea or vomiting.  Dispense: 30 tablet; Refill: 0  Preterm labor symptoms and general obstetric precautions including but not limited to vaginal bleeding, contractions, leaking of fluid and fetal movement were reviewed in detail with the patient.  Please refer to After Visit Summary for other counseling recommendations.   Return in about 2 weeks (around 05/19/2021) for follow up HR OB visit.  Future Appointments  Date Time Provider [place]  05/19/2021  2:50 PM Chancy Milroy, MD [place] None  05/28/2021  4:00 PM Berniece Salines, DO [facility] None  06/01/2021  3:30 PM [facility] NURSE [facility]  06/01/2021  3:45 PM [facility] US1 [facility]    Genia Del, MD

## 2021-05-18 ENCOUNTER — Encounter (HOSPITAL_COMMUNITY): Payer: Self-pay | Admitting: Obstetrics and Gynecology

## 2021-05-18 ENCOUNTER — Other Ambulatory Visit: Payer: Self-pay

## 2021-05-18 ENCOUNTER — Inpatient Hospital Stay (HOSPITAL_COMMUNITY)
Admission: AD | Admit: 2021-05-18 | Discharge: 2021-05-18 | Disposition: A | Discharge status: Home / Self Care | Payer: Medicaid Other | Attending: Obstetrics and Gynecology | Admitting: Obstetrics and Gynecology

## 2021-05-18 DIAGNOSIS — Z3A32 32 weeks gestation of pregnancy: Secondary | ICD-10-CM

## 2021-05-18 DIAGNOSIS — O368190 Decreased fetal movements, unspecified trimester, not applicable or unspecified: Secondary | ICD-10-CM

## 2021-05-18 DIAGNOSIS — I10 Essential (primary) hypertension: Secondary | ICD-10-CM

## 2021-05-18 DIAGNOSIS — O10013 Pre-existing essential hypertension complicating pregnancy, third trimester: Secondary | ICD-10-CM | POA: Insufficient documentation

## 2021-05-18 DIAGNOSIS — Z3689 Encounter for other specified antenatal screening: Secondary | ICD-10-CM | POA: Diagnosis not present

## 2021-05-18 DIAGNOSIS — Z7982 Long term (current) use of aspirin: Secondary | ICD-10-CM | POA: Insufficient documentation

## 2021-05-18 DIAGNOSIS — Z87891 Personal history of nicotine dependence: Secondary | ICD-10-CM | POA: Diagnosis not present

## 2021-05-18 DIAGNOSIS — O368130 Decreased fetal movements, third trimester, not applicable or unspecified: Secondary | ICD-10-CM | POA: Diagnosis present

## 2021-05-18 LAB — PROTEIN / CREATININE RATIO, URINE
Creatinine, Urine: 177.05 mg/dL
Protein Creatinine Ratio: 0.07 mg/mg{Cre} (ref 0.00–0.15)
Total Protein, Urine: 12 mg/dL

## 2021-05-18 LAB — CBC
HCT: 30.9 % — ABNORMAL LOW (ref 36.0–46.0)
Hemoglobin: 10.1 g/dL — ABNORMAL LOW (ref 12.0–15.0)
MCH: 28.5 pg (ref 26.0–34.0)
MCHC: 32.7 g/dL (ref 30.0–36.0)
MCV: 87.3 fL (ref 80.0–100.0)
Platelets: 265 10*3/uL (ref 150–400)
RBC: 3.54 MIL/uL — ABNORMAL LOW (ref 3.87–5.11)
RDW: 12.2 % (ref 11.5–15.5)
WBC: 10.2 10*3/uL (ref 4.0–10.5)
nRBC: 0 % (ref 0.0–0.2)

## 2021-05-18 LAB — COMPREHENSIVE METABOLIC PANEL
ALT: 15 U/L (ref 0–44)
AST: 17 U/L (ref 15–41)
Albumin: 2.9 g/dL — ABNORMAL LOW (ref 3.5–5.0)
Alkaline Phosphatase: 68 U/L (ref 38–126)
Anion gap: 8 (ref 5–15)
BUN: 10 mg/dL (ref 6–20)
CO2: 19 mmol/L — ABNORMAL LOW (ref 22–32)
Calcium: 8.7 mg/dL — ABNORMAL LOW (ref 8.9–10.3)
Chloride: 105 mmol/L (ref 98–111)
Creatinine, Ser: 0.75 mg/dL (ref 0.44–1.00)
GFR, Estimated: >60 mL/min (ref >60)
Glucose, Bld: 79 mg/dL (ref 70–99)
Potassium: 3.6 mmol/L (ref 3.5–5.1)
Sodium: 132 mmol/L — ABNORMAL LOW (ref 135–145)
Total Bilirubin: 0.3 mg/dL (ref 0.3–1.2)
Total Protein: 6.1 g/dL — ABNORMAL LOW (ref 6.5–8.1)

## 2021-05-18 MED ORDER — ACETAMINOPHEN 500 MG PO TABS
1000.0000 mg | ORAL_TABLET | Freq: Once | ORAL | Status: AC
  Administered 2021-05-18: 1000 mg via ORAL
  Filled 2021-05-18: qty 2

## 2021-05-18 NOTE — Discharge Instructions (Signed)
You came to the hospital because you did not feel your baby move since last night. We put your baby on the monitor and baby looked normal. Also you had some high blood pressures on the ambulance but normal blood pressures were normal here. We did labs which were normal. We thought you were safe to go home.  Please seek medical care if you have heavy vaginal bleeding, feel like your baby is not moving (after doing kick counts), or any feeling your water is broken.

## 2021-05-18 NOTE — MAU Note (Signed)
Patient arrived to MAU by EMS for DFM since 3am. She reports a headache that started last night and got worse today. She is rating the pain 7/10. She stated that she has been under a lot of stress due to recent deaths of family members.   Patient denies vaginal bleeding and or leakage of fluid.   Patient given fetal movement clicker and given instructions. Patient verbalized understanding, and agreed. EFM commenced.

## 2021-05-18 NOTE — MAU Provider Note (Signed)
History     CSN: [id number]  Arrival date and time: 05/18/21 1801   None     Chief Complaint  Patient presents with   Decreased Fetal Movement   HPI 26 yo F G1P0 at 40w6dwho presents via EMS for decreased fetal movement (hasn't felt movement for 16 hrs) and also found to have elevated Bps on EMS truck  #DFM - last felt baby move 3AM (16 hours ago) - last growth [place]20[place] EFW 28%ile - last ate and drank 3AM - feels some intermittent contractions - per patient contractions are not that bad  #cHTN Known cHTN BP on EMS truck 140/92 Has headache now 7/10  Headache started last night now getting worse Took tylenol last night  BP here in MAU 129/80  Would like some Tylenol now  OB History     Gravida  1   Para  0   Term  0   Preterm  0   AB  0   Living  0      SAB  0   IAB  0   Ectopic  0   Multiple  0   Live Births  0           Past Medical History:  Diagnosis Date   Asthma    Heart murmur    Hypertension    Panic attacks     History reviewed. No pertinent surgical history.  Family History  Problem Relation Age of Onset   Hypertension Mother    Hypertension Father    Hypertension Other     Social History   Tobacco Use   Smoking status: Former    Types: Cigars   Smokeless tobacco: Never  Vaping Use   Vaping Use: Former  Substance Use Topics   Alcohol use: Not Currently    Comment: Occasional, not since confirmed pregnancy   Drug use: No    Allergies:  Allergies  Allergen Reactions   Ibuprofen Other (See Comments)    "it just won't stay down makes me feel sick and makes my stomach burn"   Mushroom Extract Complex Other (See Comments)    "her body mood just changes"    Medications Prior to Admission  Medication Sig Dispense Refill Last Dose   aspirin EC 81 MG tablet Take 1 tablet (81 mg total) by mouth daily. Take after 12 weeks for prevention of preeclampsia later in pregnancy. 30 tablet 2 05/18/2021    cyclobenzaprine (FLEXERIL) 5 MG tablet Take 5 mg by mouth 3 (three) times daily as needed for muscle spasms.      Prenatal Vit-Fe Phos-FA-Omega (VITAFOL GUMMIES) 3.33-0.333-34.8 MG CHEW Chew 3 each by mouth daily. 90 tablet 11 05/18/2021   promethazine (PHENERGAN) 12.5 MG tablet Take 1 tablet (12.5 mg total) by mouth every 8 (eight) hours as needed for nausea or vomiting. 30 tablet 0 Past Week   Blood Pressure Monitoring (BLOOD PRESSURE KIT) DEVI 1 kit by Does not apply route once a week. 1 each 0    Ipratropium-Albuterol (COMBIVENT) 20-100 MCG/ACT AERS respimat Inhale 1 puff into the lungs every 6 (six) hours as needed for wheezing or shortness of breath. 1 each 3     Review of Systems  Constitutional:  Negative for fever.  Respiratory:  Negative for shortness of breath.   Gastrointestinal:  Negative for abdominal pain, nausea and vomiting.       Denies contractions  Endocrine: Negative for polyuria.  Genitourinary:  Negative for dysuria.  Musculoskeletal:  Negative for back pain.  Neurological:  Positive for headaches.  Physical Exam   Blood pressure 129/80, pulse 92, temperature 98.1 F (36.7 C), temperature source Oral, resp. rate 16, height 5' 4"  (1.626 m), weight 86.2 kg, last menstrual period 09/30/2020, SpO2 98 %.  Physical Exam Vitals and nursing note reviewed. Exam conducted with a chaperone present.  HENT:     Head: Normocephalic.  Cardiovascular:     Rate and Rhythm: Normal rate.     Pulses: Normal pulses.  Pulmonary:     Effort: Pulmonary effort is normal.  Abdominal:     General: Abdomen is flat. There is no distension.     Tenderness: There is no abdominal tenderness. There is no guarding.     Comments: No TTP  Musculoskeletal:        General: Normal range of motion.  Skin:    General: Skin is warm.     Capillary Refill: Capillary refill takes less than 2 seconds.  Neurological:     General: No focal deficit present.     Mental Status: She is alert and oriented  to person, place, and time.  Psychiatric:        Mood and Affect: Mood normal.    MAU Course  Procedures  NST - reactive HR: 145-150 baseline, more than three 15x15 accels present including accels to 170s, initially had two decels when first on monitor while on back, no decelerations since last 1.5 hours.   Contractions: had 4 for entire two hours of monitoring. Patient did not feel contractions while happening  MDM Moderate Presented for DFM, had reactive NST. Also with HA and elevated BP in ambulance but normal BP here. PreE labs sent Assessment and Plan  26 yo F G1P0 at 3w6dwho presents via EMS for decreased fetal movement (hasn't felt movement for 16 hrs) and also found to have elevated Bps on EMS truck  Decreased fetal movement Patient presented via EMS for DFM. On monitor NST reactive (although initially had two decels). Last 1.5 hours of monitoring without any decels and frequent 15x15 accels.  - discussed kick counts - discussed hydration and eating  - discussed return precautions in detail  2. Headache  3. cHTN no meds Patient with headache that she has not taken anything for. BP in ambulance mild range. BP here all normotensive. Pre-E labs normal platelet and normal LFTs. Protein: creatinine ratio was normal at 0.07 - tylenol given for headache and given crackers and water since did not eat or drink all day - ok to discharge home with return precautions - has next prenatal appt tomorrow  ARenard Matter MD, MPH OB Fellow, Faculty Practice

## 2021-05-19 ENCOUNTER — Ambulatory Visit (INDEPENDENT_AMBULATORY_CARE_PROVIDER_SITE_OTHER): Payer: Medicaid Other | Admitting: Obstetrics and Gynecology

## 2021-05-19 ENCOUNTER — Encounter: Payer: Self-pay | Admitting: Obstetrics and Gynecology

## 2021-05-19 VITALS — BP 129/75 | HR 88 | Wt 192.0 lb

## 2021-05-19 DIAGNOSIS — O10912 Unspecified pre-existing hypertension complicating pregnancy, second trimester: Secondary | ICD-10-CM

## 2021-05-19 DIAGNOSIS — O283 Abnormal ultrasonic finding on antenatal screening of mother: Secondary | ICD-10-CM

## 2021-05-19 DIAGNOSIS — O0990 Supervision of high risk pregnancy, unspecified, unspecified trimester: Secondary | ICD-10-CM

## 2021-05-19 NOTE — Progress Notes (Signed)
Subjective:  Mackenzie Schultz is a 26 y.o. G1P0000 at [redacted]w[redacted]d being seen today for ongoing prenatal care.  She is currently monitored for the following issues for this high-risk pregnancy and has Supervision of high risk pregnancy, antepartum; Maternal obesity affecting pregnancy, antepartum; Heart murmur; Chronic hypertension in obstetric context in second trimester; and Abnormal fetal ultrasound on their problem list.  Patient reports general discomforts of pregnancy.  Contractions: Irritability. Vag. Bleeding: None.  Movement: Present. Denies leaking of fluid.   The following portions of the patient's history were reviewed and updated as appropriate: allergies, current medications, past family history, past medical history, past social history, past surgical history and problem list. Problem list updated.  Objective:   Vitals:   05/19/21 1431  BP: 129/75  Pulse: 88  Weight: 192 lb (87.1 kg)    Fetal Status: Fetal Heart Rate (bpm): 152   Movement: Present     General:  Alert, oriented and cooperative. Patient is in no acute distress.  Skin: Skin is warm and dry. No rash noted.   Cardiovascular: Normal heart rate noted  Respiratory: Normal respiratory effort, no problems with respiration noted  Abdomen: Soft, gravid, appropriate for gestational age. Pain/Pressure: Present     Pelvic:  Cervical exam deferred        Extremities: Normal range of motion.  Edema: Trace  Mental Status: Normal mood and affect. Normal behavior. Normal judgment and thought content.   Urinalysis:      Assessment and Plan:  Pregnancy: G1P0000 at [redacted]w[redacted]d  1. Supervision of high risk pregnancy, antepartum Stable   2. Chronic hypertension in obstetric context in second trimester BP stable Growth 28 % 05/03/21 F/U growth scan scheduled  3. Abnormal fetal ultrasound UTD  Preterm labor symptoms and general obstetric precautions including but not limited to vaginal bleeding, contractions, leaking of fluid and  fetal movement were reviewed in detail with the patient. Please refer to After Visit Summary for other counseling recommendations.  Return in about 2 weeks (around 06/02/2021) for OB visit, face to face, MD only.   Chancy Milroy, MD

## 2021-05-19 NOTE — Patient Instructions (Signed)

## 2021-05-28 ENCOUNTER — Ambulatory Visit: Payer: Medicaid Other | Admitting: Cardiology

## 2021-06-01 ENCOUNTER — Other Ambulatory Visit: Payer: Self-pay | Admitting: *Deleted

## 2021-06-01 ENCOUNTER — Ambulatory Visit: Payer: Medicaid Other

## 2021-06-01 DIAGNOSIS — O10913 Unspecified pre-existing hypertension complicating pregnancy, third trimester: Secondary | ICD-10-CM

## 2021-06-02 ENCOUNTER — Ambulatory Visit: Payer: Medicaid Other | Attending: Obstetrics | Admitting: *Deleted

## 2021-06-02 ENCOUNTER — Ambulatory Visit (INDEPENDENT_AMBULATORY_CARE_PROVIDER_SITE_OTHER): Payer: Medicaid Other | Admitting: Obstetrics and Gynecology

## 2021-06-02 ENCOUNTER — Encounter: Payer: Self-pay | Admitting: Obstetrics and Gynecology

## 2021-06-02 ENCOUNTER — Ambulatory Visit: Payer: Medicaid Other | Admitting: *Deleted

## 2021-06-02 ENCOUNTER — Other Ambulatory Visit: Payer: Self-pay

## 2021-06-02 VITALS — BP 117/68 | HR 88

## 2021-06-02 VITALS — BP 126/76 | HR 90 | Wt 194.6 lb

## 2021-06-02 DIAGNOSIS — O0990 Supervision of high risk pregnancy, unspecified, unspecified trimester: Secondary | ICD-10-CM

## 2021-06-02 DIAGNOSIS — Z3689 Encounter for other specified antenatal screening: Secondary | ICD-10-CM | POA: Insufficient documentation

## 2021-06-02 DIAGNOSIS — O283 Abnormal ultrasonic finding on antenatal screening of mother: Secondary | ICD-10-CM

## 2021-06-02 DIAGNOSIS — Z3A35 35 weeks gestation of pregnancy: Secondary | ICD-10-CM | POA: Insufficient documentation

## 2021-06-02 DIAGNOSIS — O10913 Unspecified pre-existing hypertension complicating pregnancy, third trimester: Secondary | ICD-10-CM

## 2021-06-02 DIAGNOSIS — O99210 Obesity complicating pregnancy, unspecified trimester: Secondary | ICD-10-CM

## 2021-06-02 DIAGNOSIS — O10912 Unspecified pre-existing hypertension complicating pregnancy, second trimester: Secondary | ICD-10-CM

## 2021-06-02 NOTE — Procedures (Signed)
Mackenzie Schultz 1995-06-23 102w0d  Fetus A Non-Stress Test Interpretation for 06/02/21  Indication: Chronic Hypertenstion  Fetal Heart Rate A Mode: External Baseline Rate (A): 150 bpm Variability: Moderate Accelerations: 15 x 15 Decelerations: None Multiple birth?: No  Uterine Activity Mode: Palpation, Toco Contraction Frequency (min): i uc with ui Contraction Duration (sec): 60 Contraction Quality: Mild Resting Tone Palpated: Relaxed Resting Time: Adequate  Interpretation (Fetal Testing) Nonstress Test Interpretation: Reactive Overall Impression: Reassuring for gestational age Comments: Dr. Parke Poisson reviewed tracing

## 2021-06-02 NOTE — Progress Notes (Signed)
Subjective:  Mackenzie Schultz is a 26 y.o. G1P0000 at [redacted]w[redacted]d being seen today for ongoing prenatal care.  She is currently monitored for the following issues for this high-risk pregnancy and has Supervision of high risk pregnancy, antepartum; Maternal obesity affecting pregnancy, antepartum; Heart murmur; Chronic hypertension in obstetric context in second trimester; and Abnormal fetal ultrasound on their problem list.  Patient reports general discomforts of pregnancy.  Contractions: Irritability. Vag. Bleeding: None.  Movement: Present. Denies leaking of fluid.   The following portions of the patient's history were reviewed and updated as appropriate: allergies, current medications, past family history, past medical history, past social history, past surgical history and problem list. Problem list updated.  Objective:   Vitals:   06/02/21 1336  BP: 126/76  Pulse: 90  Weight: 194 lb 9.6 oz (88.3 kg)    Fetal Status: Fetal Heart Rate (bpm): 142 Fundal Height: 35 cm Movement: Present     General:  Alert, oriented and cooperative. Patient is in no acute distress.  Skin: Skin is warm and dry. No rash noted.   Cardiovascular: Normal heart rate noted  Respiratory: Normal respiratory effort, no problems with respiration noted  Abdomen: Soft, gravid, appropriate for gestational age. Pain/Pressure: Present     Pelvic:  Cervical exam deferred        Extremities: Normal range of motion.  Edema: Trace  Mental Status: Normal mood and affect. Normal behavior. Normal judgment and thought content.   Urinalysis:      Assessment and Plan:  Pregnancy: G1P0000 at [redacted]w[redacted]d  1. Supervision of high risk pregnancy, antepartum Stable GBS next visit   2. Abnormal fetal ultrasound UTD, resolved on last U/S  3. Chronic hypertension in obstetric context in second trimester BP stable without meds Growth scan next week  Preterm labor symptoms and general obstetric precautions including but not limited to  vaginal bleeding, contractions, leaking of fluid and fetal movement were reviewed in detail with the patient. Please refer to After Visit Summary for other counseling recommendations.  Return in about 1 week (around 06/09/2021) for OB visit, face to face, MD only.   Hermina Staggers, MD

## 2021-06-02 NOTE — Patient Instructions (Signed)

## 2021-06-09 ENCOUNTER — Other Ambulatory Visit: Payer: Self-pay | Admitting: *Deleted

## 2021-06-09 ENCOUNTER — Encounter: Payer: Self-pay | Admitting: *Deleted

## 2021-06-09 ENCOUNTER — Ambulatory Visit: Payer: Medicaid Other | Attending: Obstetrics and Gynecology | Admitting: *Deleted

## 2021-06-09 ENCOUNTER — Other Ambulatory Visit: Payer: Self-pay

## 2021-06-09 ENCOUNTER — Ambulatory Visit (HOSPITAL_BASED_OUTPATIENT_CLINIC_OR_DEPARTMENT_OTHER): Payer: Medicaid Other

## 2021-06-09 VITALS — BP 117/70 | HR 101

## 2021-06-09 DIAGNOSIS — O0990 Supervision of high risk pregnancy, unspecified, unspecified trimester: Secondary | ICD-10-CM

## 2021-06-09 DIAGNOSIS — O10919 Unspecified pre-existing hypertension complicating pregnancy, unspecified trimester: Secondary | ICD-10-CM

## 2021-06-09 DIAGNOSIS — O35EXX0 Maternal care for other (suspected) fetal abnormality and damage, fetal genitourinary anomalies, not applicable or unspecified: Secondary | ICD-10-CM | POA: Insufficient documentation

## 2021-06-09 DIAGNOSIS — Z3A35 35 weeks gestation of pregnancy: Secondary | ICD-10-CM | POA: Insufficient documentation

## 2021-06-09 DIAGNOSIS — O10013 Pre-existing essential hypertension complicating pregnancy, third trimester: Secondary | ICD-10-CM | POA: Diagnosis not present

## 2021-06-09 DIAGNOSIS — O283 Abnormal ultrasonic finding on antenatal screening of mother: Secondary | ICD-10-CM

## 2021-06-09 DIAGNOSIS — O99213 Obesity complicating pregnancy, third trimester: Secondary | ICD-10-CM | POA: Insufficient documentation

## 2021-06-09 DIAGNOSIS — O99210 Obesity complicating pregnancy, unspecified trimester: Secondary | ICD-10-CM

## 2021-06-09 DIAGNOSIS — Z6832 Body mass index (BMI) 32.0-32.9, adult: Secondary | ICD-10-CM

## 2021-06-09 DIAGNOSIS — O10913 Unspecified pre-existing hypertension complicating pregnancy, third trimester: Secondary | ICD-10-CM | POA: Insufficient documentation

## 2021-06-10 ENCOUNTER — Ambulatory Visit (INDEPENDENT_AMBULATORY_CARE_PROVIDER_SITE_OTHER): Payer: Medicaid Other | Admitting: Obstetrics

## 2021-06-10 ENCOUNTER — Encounter: Payer: Self-pay | Admitting: Obstetrics

## 2021-06-10 ENCOUNTER — Other Ambulatory Visit (HOSPITAL_COMMUNITY)
Admission: RE | Admit: 2021-06-10 | Discharge: 2021-06-10 | Disposition: A | Discharge status: Home / Self Care | Payer: Medicaid Other | Source: Ambulatory Visit | Attending: Obstetrics | Admitting: Obstetrics

## 2021-06-10 VITALS — BP 133/76 | HR 81 | Wt 198.0 lb

## 2021-06-10 DIAGNOSIS — O0990 Supervision of high risk pregnancy, unspecified, unspecified trimester: Secondary | ICD-10-CM

## 2021-06-10 DIAGNOSIS — I10 Essential (primary) hypertension: Secondary | ICD-10-CM

## 2021-06-10 LAB — OB RESULTS CONSOLE GC/CHLAMYDIA: Gonorrhea: NEGATIVE

## 2021-06-10 NOTE — Progress Notes (Signed)
Subjective:  Mackenzie Schultz is a 26 y.o. G1P0000 at [redacted]w[redacted]d being seen today for ongoing prenatal care.  She is currently monitored for the following issues for this high-risk pregnancy and has Supervision of high risk pregnancy, antepartum; Maternal obesity affecting pregnancy, antepartum; Heart murmur; Chronic hypertension in obstetric context in second trimester; and Abnormal fetal ultrasound on their problem list.  Patient reports backache.  Contractions: Irregular. Vag. Bleeding: None.  Movement: Present. Denies leaking of fluid.   The following portions of the patient's history were reviewed and updated as appropriate: allergies, current medications, past family history, past medical history, past social history, past surgical history and problem list. Problem list updated.  Objective:   Vitals:   06/10/21 1403  BP: 133/76  Pulse: 81  Weight: 198 lb (89.8 kg)    Fetal Status:     Movement: Present     General:  Alert, oriented and cooperative. Patient is in no acute distress.  Skin: Skin is warm and dry. No rash noted.   Cardiovascular: Normal heart rate noted  Respiratory: Normal respiratory effort, no problems with respiration noted  Abdomen: Soft, gravid, appropriate for gestational age. Pain/Pressure: Absent     Pelvic:  Cervical exam deferred        Extremities: Normal range of motion.     Mental Status: Normal mood and affect. Normal behavior. Normal judgment and thought content.   Urinalysis:      Assessment and Plan:  Pregnancy: G1P0000 at [redacted]w[redacted]d  1. Supervision of high risk pregnancy, antepartum Rx: - Culture, beta strep (group b only) - Cervicovaginal ancillary only( [place])  2. Chronic hypertension - BP clinically stable    There are no diagnoses linked to this encounter. Preterm labor symptoms and general obstetric precautions including but not limited to vaginal bleeding, contractions, leaking of fluid and fetal movement were reviewed in detail with  the patient. Please refer to After Visit Summary for other counseling recommendations.   Return in about 1 week (around 06/17/2021) for [facility].   Shelly Bombard, MD  06/10/21

## 2021-06-11 ENCOUNTER — Ambulatory Visit: Payer: Medicaid Other | Admitting: Cardiology

## 2021-06-11 LAB — CERVICOVAGINAL ANCILLARY ONLY
Bacterial Vaginitis (gardnerella): NEGATIVE
Candida Glabrata: NEGATIVE
Candida Vaginitis: NEGATIVE
Chlamydia: NEGATIVE
Comment: NEGATIVE
Comment: NEGATIVE
Comment: NEGATIVE
Comment: NEGATIVE
Comment: NEGATIVE
Comment: NORMAL
Neisseria Gonorrhea: NEGATIVE
Trichomonas: NEGATIVE

## 2021-06-14 LAB — CULTURE, BETA STREP (GROUP B ONLY): Strep Gp B Culture: NEGATIVE

## 2021-06-17 ENCOUNTER — Encounter: Payer: Self-pay | Admitting: Obstetrics

## 2021-06-17 ENCOUNTER — Other Ambulatory Visit: Payer: Self-pay

## 2021-06-17 ENCOUNTER — Ambulatory Visit (INDEPENDENT_AMBULATORY_CARE_PROVIDER_SITE_OTHER): Payer: Medicaid Other | Admitting: Obstetrics

## 2021-06-17 VITALS — BP 122/74 | HR 98 | Wt 193.0 lb

## 2021-06-17 DIAGNOSIS — O0990 Supervision of high risk pregnancy, unspecified, unspecified trimester: Secondary | ICD-10-CM

## 2021-06-17 DIAGNOSIS — I10 Essential (primary) hypertension: Secondary | ICD-10-CM

## 2021-06-17 NOTE — Progress Notes (Addendum)
Subjective:  ?Mackenzie Schultz is a 26 y.o. G1P0000 at [redacted]w[redacted]d being seen today for ongoing prenatal care.  She is currently monitored for the following issues for this high-risk pregnancy and has Supervision of high risk pregnancy, antepartum; Maternal obesity affecting pregnancy, antepartum; Heart murmur; Chronic hypertension in obstetric context in second trimester; and Abnormal fetal ultrasound on their problem list. ? ?Patient reports backache.  Contractions: Irritability. Vag. Bleeding: None.  Movement: Present. Denies leaking of fluid.  ? ?The following portions of the patient's history were reviewed and updated as appropriate: allergies, current medications, past family history, past medical history, past social history, past surgical history and problem list. Problem list updated. ? ?Objective:  ? ?Vitals:  ? 06/17/21 1452  ?BP: 122/74  ?Pulse: 98  ?Weight: 193 lb (87.5 kg)  ? ? ?Fetal Status: Fetal Heart Rate (bpm): 148   Movement: Present    ? ?General:  Alert, oriented and cooperative. Patient is in no acute distress.  ?Skin: Skin is warm and dry. No rash noted.   ?Cardiovascular: Normal heart rate noted  ?Respiratory: Normal respiratory effort, no problems with respiration noted  ?Abdomen: Soft, gravid, appropriate for gestational age. Pain/Pressure: Present     ?Pelvic:  Cervical exam deferred        ?Extremities: Normal range of motion.  Edema: Trace  ?Mental Status: Normal mood and affect. Normal behavior. Normal judgment and thought content.  ? ?Urinalysis:     ? ?Assessment and Plan:  ?Pregnancy: G1P0000 at [redacted]w[redacted]d ? ?1. Supervision of high risk pregnancy, antepartum ? ?2. Chronic hypertension ?- taking Baby ASA ?- BP clinically stable, no BP meds ? ? ?There are no diagnoses linked to this encounter. ?Term labor symptoms and general obstetric precautions including but not limited to vaginal bleeding, contractions, leaking of fluid and fetal movement were reviewed in detail with the patient. ?Please  refer to After Visit Summary for other counseling recommendations.  ? ?Return in about 1 week (around 06/24/2021) for [facility]. ? ? ?Shelly Bombard, MD  ?06/17/21  ?

## 2021-06-24 ENCOUNTER — Other Ambulatory Visit: Payer: Self-pay

## 2021-06-24 ENCOUNTER — Ambulatory Visit (INDEPENDENT_AMBULATORY_CARE_PROVIDER_SITE_OTHER): Payer: Medicaid Other | Admitting: Obstetrics

## 2021-06-24 ENCOUNTER — Encounter: Payer: Self-pay | Admitting: Obstetrics

## 2021-06-24 VITALS — BP 125/82 | HR 98 | Wt 199.0 lb

## 2021-06-24 DIAGNOSIS — O0990 Supervision of high risk pregnancy, unspecified, unspecified trimester: Secondary | ICD-10-CM

## 2021-06-24 DIAGNOSIS — O99210 Obesity complicating pregnancy, unspecified trimester: Secondary | ICD-10-CM

## 2021-06-24 DIAGNOSIS — I10 Essential (primary) hypertension: Secondary | ICD-10-CM

## 2021-06-24 NOTE — Progress Notes (Signed)
Subjective:  ?Mackenzie Schultz is a 26 y.o. G1P0000 at [redacted]w[redacted]d being seen today for ongoing prenatal care.  She is currently monitored for the following issues for this high-risk pregnancy and has Supervision of high risk pregnancy, antepartum; Maternal obesity affecting pregnancy, antepartum; Heart murmur; Chronic hypertension in obstetric context in second trimester; and Abnormal fetal ultrasound on their problem list. ? ?Patient reports no complaints.  Contractions: Irregular. Vag. Bleeding: None.  Movement: Present. Denies leaking of fluid.  ? ?The following portions of the patient's history were reviewed and updated as appropriate: allergies, current medications, past family history, past medical history, past social history, past surgical history and problem list. Problem list updated. ? ?Objective:  ? ?Vitals:  ? 06/24/21 1526  ?BP: 125/82  ?Pulse: 98  ?Weight: 199 lb (90.3 kg)  ? ? ?Fetal Status:     Movement: Present    ? ?General:  Alert, oriented and cooperative. Patient is in no acute distress.  ?Skin: Skin is warm and dry. No rash noted.   ?Cardiovascular: Normal heart rate noted  ?Respiratory: Normal respiratory effort, no problems with respiration noted  ?Abdomen: Soft, gravid, appropriate for gestational age. Pain/Pressure: Present     ?Pelvic:  Cervical exam deferred        ?Extremities: Normal range of motion.     ?Mental Status: Normal mood and affect. Normal behavior. Normal judgment and thought content.  ? ?Urinalysis:     ? ?BPP: ? ?  ?[place] MFM FETAL BPP WO NON STRESS (Accession [id number]) (Order [id number]) ?Imaging ?Date: 06/09/2021 Department: [facility] Released By: Claudius Sis Authorizing: Tama High, MD  ? ?Exam Status ? ?Status  ?Final [99]  ? ?PACS Intelerad Image Link ? ? Show images for [place] MFM FETAL BPP WO NON STRESS ?Study Result ? ?Narrative & Impression  ?---------------------------------------------------------------------- ?  OBSTETRICS REPORT                       (Signed Final 06/09/2021 02:34 pm) ?---------------------------------------------------------------------- ?Patient Info ? ID #:       [id number]                          D.O.B.:  June 28, 1995 (25 yrs) ? Name:       Mackenzie Schultz              Visit Date: 06/09/2021 02:03 pm ?---------------------------------------------------------------------- ?Performed By ? Attending:        Tama High MD        Ref. Address:     Faculty ? Performed By:     Benson [place]          [facility]                                     [facility]                                                            [facility] ? Referred By:      Gigi Gin ?                   CONSTANT MD ?---------------------------------------------------------------------- ?Orders ? #  Description                           Code        Ordered By ? 1  [place] MFM FETAL BPP WO NON               [id number]    RAVI SHANKAR ?    STRESS ?---------------------------------------------------------------------- ? #  Order #                     Accession #                Episode # ? 1  [id number]                   [id number]                 [id number] ?---------------------------------------------------------------------- ?Indications ? Obesity complicating pregnancy, third          O99.213 ? trimester (BMI 32) ? Pre-existing essential hypertension            O10.013 ? complicating pregnancy, third trimester (no ? meds) ? Pyelectasis of fetus on prenatal ultrasound    O28.3 ? (Resolved) ? LR NIPS; neg AFP ? [redacted] weeks gestation of pregnancy                Z3A.35 ?---------------------------------------------------------------------- ?Fetal Evaluation ? Num Of Fetuses:         1 ? Fetal Heart Rate(bpm):  153 ? Cardiac Activity:       Observed ? Presentation:           Cephalic ? Placenta:               Anterior ? P. Cord  Insertion:      Previously Visualized ? Amniotic Fluid ? AFI FV:      Within normal limits ? AFI Sum(cm)     %Tile       Largest Pocket(cm) ? 11.42           31          4.44 ? RUQ(cm)       RLQ(cm)       LUQ(cm)        LLQ(cm) ? 0             3.14          3.84           4.44 ?---------------------------------------------------------------------- ?Biophysical Evaluation ? Amniotic F.V:   Pocket => 2 cm             F. Tone:        Observed ? F. Movement:    Observed                   Score:          8/8 ? F. Breathing:  Observed ?---------------------------------------------------------------------- ?Gestational Age ? Best:          35w 2d     Det. By:  U/S C R L  (12/23/20)    EDD:   07/12/21 ?---------------------------------------------------------------------- ?Anatomy ? Stomach:               Appears normal, left   Bladder:                Appears normal ?                        sided ? Kidneys:               Appear normal ?---------------------------------------------------------------------- ?Impression ? Chronic hypertension. Well-controlled without ? antihypertensives . ? BP at our office: 117/70 mm Hg . ?---------------------------------------------------------------------- ?Recommendations ? -An appointment was made for her to return in 3 weeks for ? fetal growth assessment. ?---------------------------------------------------------------------- ?                 Tama High, MD ?Electronically Signed Final Report   06/09/2021 02:34 pm ?----------------------------------------------------------------------  ? ? ?Assessment and Plan:  ?Pregnancy: G1P0000 at [redacted]w[redacted]d ? ?1. Supervision of high risk pregnancy, antepartum ? ?2. Chronic hypertension ?- BP well contolled ? ?3. Maternal obesity affecting pregnancy, antepartum ? ? ?Term labor symptoms and general obstetric precautions including but not limited to vaginal bleeding, contractions, leaking of fluid and fetal movement were reviewed in detail with the  patient. ?Please refer to After Visit Summary for other counseling recommendations.  ? ?Return in about 1 week (around 07/01/2021) for [facility]. ? ? ?Shelly Bombard, MD  ?06/24/21  ?

## 2021-06-29 ENCOUNTER — Ambulatory Visit: Payer: Medicaid Other | Admitting: *Deleted

## 2021-06-29 ENCOUNTER — Ambulatory Visit: Payer: Medicaid Other | Attending: Obstetrics and Gynecology

## 2021-06-29 ENCOUNTER — Other Ambulatory Visit: Payer: Self-pay

## 2021-06-29 VITALS — BP 119/69 | HR 84

## 2021-06-29 DIAGNOSIS — O10013 Pre-existing essential hypertension complicating pregnancy, third trimester: Secondary | ICD-10-CM | POA: Diagnosis not present

## 2021-06-29 DIAGNOSIS — O10913 Unspecified pre-existing hypertension complicating pregnancy, third trimester: Secondary | ICD-10-CM | POA: Insufficient documentation

## 2021-06-29 DIAGNOSIS — O0990 Supervision of high risk pregnancy, unspecified, unspecified trimester: Secondary | ICD-10-CM

## 2021-06-29 DIAGNOSIS — O99213 Obesity complicating pregnancy, third trimester: Secondary | ICD-10-CM

## 2021-06-29 DIAGNOSIS — Z3A38 38 weeks gestation of pregnancy: Secondary | ICD-10-CM

## 2021-06-29 DIAGNOSIS — O99210 Obesity complicating pregnancy, unspecified trimester: Secondary | ICD-10-CM | POA: Insufficient documentation

## 2021-06-29 DIAGNOSIS — O283 Abnormal ultrasonic finding on antenatal screening of mother: Secondary | ICD-10-CM

## 2021-07-01 ENCOUNTER — Encounter: Payer: Self-pay | Admitting: Obstetrics

## 2021-07-01 ENCOUNTER — Other Ambulatory Visit: Payer: Self-pay

## 2021-07-01 ENCOUNTER — Ambulatory Visit (INDEPENDENT_AMBULATORY_CARE_PROVIDER_SITE_OTHER): Payer: Medicaid Other | Admitting: Obstetrics

## 2021-07-01 VITALS — BP 122/78 | HR 92 | Wt 195.1 lb

## 2021-07-01 DIAGNOSIS — Z3A39 39 weeks gestation of pregnancy: Secondary | ICD-10-CM

## 2021-07-01 DIAGNOSIS — O0993 Supervision of high risk pregnancy, unspecified, third trimester: Secondary | ICD-10-CM

## 2021-07-01 DIAGNOSIS — O99213 Obesity complicating pregnancy, third trimester: Secondary | ICD-10-CM

## 2021-07-01 DIAGNOSIS — I10 Essential (primary) hypertension: Secondary | ICD-10-CM

## 2021-07-01 NOTE — Progress Notes (Signed)
Subjective:  ?Mackenzie Schultz is a 26 y.o. G1P0000 at [redacted]w[redacted]d being seen today for ongoing prenatal care.  She is currently monitored for the following issues for this high-risk pregnancy and has Supervision of high risk pregnancy, antepartum; Maternal obesity affecting pregnancy, antepartum; Heart murmur; Chronic hypertension in obstetric context in second trimester; and Abnormal fetal ultrasound on their problem list. ? ?Patient reports no complaints.  Contractions: Irregular. Vag. Bleeding: None.  Movement: Present. Denies leaking of fluid.  ? ?The following portions of the patient's history were reviewed and updated as appropriate: allergies, current medications, past family history, past medical history, past social history, past surgical history and problem list. Problem list updated. ? ?Objective:  ? ?Vitals:  ? 07/01/21 1509  ?BP: 122/78  ?Pulse: 92  ?Weight: 195 lb 1.6 oz (88.5 kg)  ? ? ?Fetal Status:     Movement: Present    ? ?General:  Alert, oriented and cooperative. Patient is in no acute distress.  ?Skin: Skin is warm and dry. No rash noted.   ?Cardiovascular: Normal heart rate noted  ?Respiratory: Normal respiratory effort, no problems with respiration noted  ?Abdomen: Soft, gravid, appropriate for gestational age. Pain/Pressure: Absent     ?Pelvic:  Cervical exam deferred        ?Extremities: Normal range of motion.  Edema: Trace  ?Mental Status: Normal mood and affect. Normal behavior. Normal judgment and thought content.  ? ?Urinalysis:     ?  ?[place] MFM OB FOLLOW UP (Accession [id number]) (Order [id number]) ?Imaging ?Date: 06/29/2021 Department: [facility] Released By: Claudius Sis Authorizing: Ulice Dash, MD  ? ?Exam Status ? ?Status  ?Final [99]  ? ?PACS Intelerad Image Link ? ? Show images for [place] MFM OB FOLLOW UP ?Study Result ? ?Narrative & Impression  ?---------------------------------------------------------------------- ? OBSTETRICS  REPORT                       (Signed Final 06/29/2021 04:14 pm) ?---------------------------------------------------------------------- ?Patient Info ? ID #:       [id number]                          D.O.B.:  1995/09/17 (25 yrs) ? Name:       Mackenzie Schultz              Visit Date: 06/29/2021 04:01 pm ?---------------------------------------------------------------------- ?Performed By ? Attending:        Johnell Comings MD         Ref. Address:     Faculty ? Performed By:     Benson [place]          [facility]                                     [facility] ?                                                            [facility]  [facility] ? Referred By:      Vickii Chafe ?                   CONSTANT MD ?---------------------------------------------------------------------- ?Orders ? #  Description                           Code        Ordered By ? 1  [place] MFM OB FOLLOW UP                   [id number]    YU FANG ?---------------------------------------------------------------------- ? #  Order #                     Accession #                Episode # ? 1  [id number]                   [id number]                 [id number] ?---------------------------------------------------------------------- ?Indications ? Obesity complicating pregnancy, third          O99.213 ? trimester (BMI 32) ? Pre-existing essential hypertension            [id number] ? complicating pregnancy, third trimester (no ? meds) ? Pyelectasis of fetus on prenatal ultrasound    O28.3 ? (Resolved) ? LR NIPS; neg AFP ? [redacted] weeks gestation of pregnancy                Z3A.38 ?---------------------------------------------------------------------- ?Fetal Evaluation ? Num Of Fetuses:         1 ? Fetal Heart Rate(bpm):  150 ? Cardiac Activity:       Observed ? Presentation:           Cephalic ? Placenta:               Anterior ? P. Cord Insertion:      Previously  Visualized ? Amniotic Fluid ? AFI FV:      Within normal limits ? AFI Sum(cm)     %Tile       Largest Pocket(cm) ? 9.54            22          3.77 ? RUQ(cm)       RLQ(cm)       LUQ(cm)        LLQ(cm) ? 1.87          1.16          2.74           3.77 ?---------------------------------------------------------------------- ?Biometry ? BPD:      92.4  mm     G. Age:  37w 4d         59  %    CI:         75.9   %    70 - 86 ?                                                         FL/HC:      20.6   %    20.9 - 22.7 ? HC:      336.2  mm  G. Age:  38w 4d         40  %    HC/AC:      0.97        0.92 - 1.05 ? AC:      347.5  mm     G. Age:  38w 5d         81  %    FL/BPD:     75.0   %    71 - 87 ? FL:       69.3  mm     G. Age:  35w 4d        4.7  %    FL/AC:      19.9   %    20 - 24 ? Est. FW:    3314  gm      7 lb 5 oz     55  % ?---------------------------------------------------------------------- ?Gestational Age ? U/S Today:     37w 4d                                        EDD:   07/16/21 ? Best:          38w 1d     Det. By:  U/S C R L  (12/23/20)    EDD:   07/12/21 ?---------------------------------------------------------------------- ?Anatomy ? Stomach:               Appears normal, left   Bladder:                Appears normal ?                        sided ? Kidneys:               Appear normal ?---------------------------------------------------------------------- ?Cervix Uterus Adnexa ? Adnexa ? No abnormality visualized. ?---------------------------------------------------------------------- ?Comments ? This patient was seen for a follow up growth scan due to a ? history of chronic hypertension that is not treated with any ? medications.  She denies any problems since her last exam. ? She was informed that the fetal growth and amniotic fluid ? level appears appropriate for her gestational age. ? As the fetal growth is within normal limits, no further exams ? were scheduled in our office. ? She should  probably be delivered by her due date. ?---------------------------------------------------------------------- ?                  Johnell Comings, MD ?Electronically Signed Final Report   06/29/2021 04:14 pm ?----------------------------------------------------------------------  ? ? ?Assessment and Plan:  ?Pregnancy: G1P0000 at [redacted]w[redacted]d ? ?1. Supervision of high risk pregnancy, antepartum ? ?2. Chronic hypertension, no meds.  Well controlled. ?- IOL at 40 weeks, per Dr. Annamaria Boots ? ?3. Maternal obesity affecting pregnancy, antepartum ? ? ?Term labor symptoms and general obstetric precautions including but not limited to vaginal bleeding, contractions, leaking of fluid and fetal movement were reviewed in detail with the patient. ?Please refer to After Visit Summary for other counseling recommendations.  ? ?Return in about 4 weeks (around 07/29/2021) for Postpartum. ? ? ?Shelly Bombard, MD  ?07/01/21  ?

## 2021-07-01 NOTE — Progress Notes (Signed)
Patient presents for ROB. Patient has no concerns today. 

## 2021-07-02 ENCOUNTER — Encounter (HOSPITAL_COMMUNITY): Payer: Self-pay | Admitting: *Deleted

## 2021-07-02 ENCOUNTER — Telehealth (HOSPITAL_COMMUNITY): Payer: Self-pay | Admitting: *Deleted

## 2021-07-02 NOTE — Telephone Encounter (Signed)
Preadmission screen  

## 2021-07-03 ENCOUNTER — Other Ambulatory Visit: Payer: Self-pay | Admitting: Advanced Practice Midwife

## 2021-07-07 ENCOUNTER — Inpatient Hospital Stay (HOSPITAL_COMMUNITY): Payer: Medicaid Other

## 2021-07-07 ENCOUNTER — Inpatient Hospital Stay (HOSPITAL_COMMUNITY)
Admission: AD | Admit: 2021-07-07 | Discharge: 2021-07-13 | DRG: 787 | Disposition: A | Discharge status: Home / Self Care | Payer: Medicaid Other | Attending: Obstetrics and Gynecology | Admitting: Obstetrics and Gynecology

## 2021-07-07 ENCOUNTER — Encounter (HOSPITAL_COMMUNITY): Payer: Self-pay | Admitting: Obstetrics and Gynecology

## 2021-07-07 ENCOUNTER — Other Ambulatory Visit: Payer: Self-pay

## 2021-07-07 DIAGNOSIS — D649 Anemia, unspecified: Secondary | ICD-10-CM | POA: Diagnosis not present

## 2021-07-07 DIAGNOSIS — O99210 Obesity complicating pregnancy, unspecified trimester: Principal | ICD-10-CM

## 2021-07-07 DIAGNOSIS — O9081 Anemia of the puerperium: Secondary | ICD-10-CM | POA: Diagnosis not present

## 2021-07-07 DIAGNOSIS — O1002 Pre-existing essential hypertension complicating childbirth: Principal | ICD-10-CM | POA: Diagnosis present

## 2021-07-07 DIAGNOSIS — O9952 Diseases of the respiratory system complicating childbirth: Secondary | ICD-10-CM | POA: Diagnosis present

## 2021-07-07 DIAGNOSIS — D62 Acute posthemorrhagic anemia: Secondary | ICD-10-CM | POA: Diagnosis not present

## 2021-07-07 DIAGNOSIS — Z87891 Personal history of nicotine dependence: Secondary | ICD-10-CM

## 2021-07-07 DIAGNOSIS — Z3A40 40 weeks gestation of pregnancy: Secondary | ICD-10-CM

## 2021-07-07 DIAGNOSIS — O99214 Obesity complicating childbirth: Secondary | ICD-10-CM | POA: Diagnosis present

## 2021-07-07 DIAGNOSIS — O0990 Supervision of high risk pregnancy, unspecified, unspecified trimester: Secondary | ICD-10-CM

## 2021-07-07 DIAGNOSIS — O9902 Anemia complicating childbirth: Secondary | ICD-10-CM | POA: Diagnosis not present

## 2021-07-07 DIAGNOSIS — J45909 Unspecified asthma, uncomplicated: Secondary | ICD-10-CM | POA: Diagnosis present

## 2021-07-07 DIAGNOSIS — O283 Abnormal ultrasonic finding on antenatal screening of mother: Secondary | ICD-10-CM

## 2021-07-07 DIAGNOSIS — O10912 Unspecified pre-existing hypertension complicating pregnancy, second trimester: Secondary | ICD-10-CM | POA: Diagnosis present

## 2021-07-07 DIAGNOSIS — O1092 Unspecified pre-existing hypertension complicating childbirth: Secondary | ICD-10-CM | POA: Diagnosis not present

## 2021-07-07 DIAGNOSIS — R011 Cardiac murmur, unspecified: Secondary | ICD-10-CM | POA: Diagnosis present

## 2021-07-07 LAB — TYPE AND SCREEN
ABO/RH(D): B POS
Antibody Screen: NEGATIVE

## 2021-07-07 LAB — COMPREHENSIVE METABOLIC PANEL
ALT: 15 U/L (ref 0–44)
AST: 22 U/L (ref 15–41)
Albumin: 3 g/dL — ABNORMAL LOW (ref 3.5–5.0)
Alkaline Phosphatase: 121 U/L (ref 38–126)
Anion gap: 11 (ref 5–15)
BUN: 16 mg/dL (ref 6–20)
CO2: 16 mmol/L — ABNORMAL LOW (ref 22–32)
Calcium: 9.3 mg/dL (ref 8.9–10.3)
Chloride: 109 mmol/L (ref 98–111)
Creatinine, Ser: 0.85 mg/dL (ref 0.44–1.00)
GFR, Estimated: >60 mL/min (ref >60)
Glucose, Bld: 76 mg/dL (ref 70–99)
Potassium: 4.3 mmol/L (ref 3.5–5.1)
Sodium: 136 mmol/L (ref 135–145)
Total Bilirubin: 0.7 mg/dL (ref 0.3–1.2)
Total Protein: 6.5 g/dL (ref 6.5–8.1)

## 2021-07-07 LAB — CBC
HCT: 34.4 % — ABNORMAL LOW (ref 36.0–46.0)
Hemoglobin: 11 g/dL — ABNORMAL LOW (ref 12.0–15.0)
MCH: 27.1 pg (ref 26.0–34.0)
MCHC: 32 g/dL (ref 30.0–36.0)
MCV: 84.7 fL (ref 80.0–100.0)
Platelets: 293 10*3/uL (ref 150–400)
RBC: 4.06 MIL/uL (ref 3.87–5.11)
RDW: 13.1 % (ref 11.5–15.5)
WBC: 10 10*3/uL (ref 4.0–10.5)
nRBC: 0 % (ref 0.0–0.2)

## 2021-07-07 LAB — PROTEIN / CREATININE RATIO, URINE
Creatinine, Urine: 47.47 mg/dL
Total Protein, Urine: <6 mg/dL

## 2021-07-07 LAB — RPR: RPR Ser Ql: NONREACTIVE

## 2021-07-07 MED ORDER — OXYCODONE-ACETAMINOPHEN 5-325 MG PO TABS: 1.0000 | ORAL_TABLET | ORAL | Status: DC | PRN

## 2021-07-07 MED ORDER — LIDOCAINE HCL (PF) 1 % IJ SOLN: 30.0000 mL | INTRAMUSCULAR | Status: DC | PRN

## 2021-07-07 MED ORDER — LACTATED RINGERS IV SOLN
500.0000 mL | INTRAVENOUS | Status: DC | PRN
  Administered 2021-07-07 – 2021-07-10 (×4): 500 mL via INTRAVENOUS

## 2021-07-07 MED ORDER — MISOPROSTOL 25 MCG QUARTER TABLET
25.0000 ug | ORAL_TABLET | ORAL | Status: DC | PRN
  Administered 2021-07-07 – 2021-07-08 (×2): 25 ug via VAGINAL
  Filled 2021-07-07 (×2): qty 1

## 2021-07-07 MED ORDER — TERBUTALINE SULFATE 1 MG/ML IJ SOLN: 0.2500 mg | Freq: Once | INTRAMUSCULAR | Status: DC | PRN

## 2021-07-07 MED ORDER — MISOPROSTOL 25 MCG QUARTER TABLET
25.0000 ug | ORAL_TABLET | ORAL | Status: DC | PRN
  Filled 2021-07-07: qty 1

## 2021-07-07 MED ORDER — FENTANYL CITRATE (PF) 100 MCG/2ML IJ SOLN
100.0000 ug | INTRAMUSCULAR | Status: DC | PRN
  Administered 2021-07-07 – 2021-07-09 (×10): 100 ug via INTRAVENOUS
  Filled 2021-07-07 (×10): qty 2

## 2021-07-07 MED ORDER — OXYTOCIN-SODIUM CHLORIDE 30-0.9 UT/500ML-% IV SOLN: 2.5000 [IU]/h | INTRAVENOUS | Status: DC

## 2021-07-07 MED ORDER — OXYCODONE-ACETAMINOPHEN 5-325 MG PO TABS: 2.0000 | ORAL_TABLET | ORAL | Status: DC | PRN

## 2021-07-07 MED ORDER — MISOPROSTOL 50MCG HALF TABLET
50.0000 ug | ORAL_TABLET | Freq: Once | ORAL | Status: AC
  Administered 2021-07-07: 50 ug via ORAL
  Filled 2021-07-07: qty 1

## 2021-07-07 MED ORDER — LACTATED RINGERS IV SOLN
INTRAVENOUS | Status: DC
  Administered 2021-07-08 – 2021-07-09 (×2): 1000 mL via INTRAVENOUS

## 2021-07-07 MED ORDER — SOD CITRATE-CITRIC ACID 500-334 MG/5ML PO SOLN
30.0000 mL | ORAL | Status: DC | PRN
  Administered 2021-07-10: 30 mL via ORAL
  Filled 2021-07-07: qty 30

## 2021-07-07 MED ORDER — OXYTOCIN-SODIUM CHLORIDE 30-0.9 UT/500ML-% IV SOLN
1.0000 m[IU]/min | INTRAVENOUS | Status: DC
  Administered 2021-07-08: 1 m[IU]/min via INTRAVENOUS
  Filled 2021-07-07: qty 500

## 2021-07-07 MED ORDER — OXYTOCIN BOLUS FROM INFUSION: 333.0000 mL | Freq: Once | INTRAVENOUS | Status: DC

## 2021-07-07 MED ORDER — ACETAMINOPHEN 325 MG PO TABS: 650.0000 mg | ORAL_TABLET | ORAL | Status: DC | PRN

## 2021-07-07 MED ORDER — MISOPROSTOL 50MCG HALF TABLET
50.0000 ug | ORAL_TABLET | ORAL | Status: DC | PRN
  Administered 2021-07-07: 50 ug via BUCCAL
  Filled 2021-07-07: qty 1

## 2021-07-07 MED ORDER — ONDANSETRON HCL 4 MG/2ML IJ SOLN
4.0000 mg | Freq: Four times a day (QID) | INTRAMUSCULAR | Status: DC | PRN
  Administered 2021-07-07 – 2021-07-09 (×4): 4 mg via INTRAVENOUS
  Filled 2021-07-07 (×4): qty 2

## 2021-07-07 MED ORDER — ZOLPIDEM TARTRATE 5 MG PO TABS
5.0000 mg | ORAL_TABLET | Freq: Every evening | ORAL | Status: DC | PRN
  Administered 2021-07-07 – 2021-07-08 (×2): 5 mg via ORAL
  Filled 2021-07-07 (×2): qty 1

## 2021-07-07 NOTE — Progress Notes (Signed)
Patient ID: Mackenzie Schultz, female   DOB: 12-19-1995, 26 y.o.   MRN: [id number] ? ?Ctx have spaced out some; wants to go walking in the halls ? ?BPs 115/63, 120.89 ?FHR 150s, +accels, no decels ?Ctx irreg ?Cx was recently FT/thick ? ?Urine p/c: too low ? ?IUP@40 .0wks  ?cHTN- pre-e labs neg ?Cx unfavorable ? ?Given 2nd dose of buccal cytotec ?Continue cytotec q 4hr; cervical foley when able ? ?Myrtis Ser CNM ?07/07/2021 ?6:46 PM ? ?

## 2021-07-07 NOTE — H&P (Addendum)
OBSTETRIC ADMISSION HISTORY AND PHYSICAL ? ?Mackenzie Schultz is a 26 y.o. female G1P0000 with IUP at 63w0dby LMP presenting for IOL d/t cHTN (no home medications and well controlled). She reports +FMs, No LOF, no VB, no blurry vision, headaches or peripheral edema, and RUQ pain.  She plans on bottle feeding. She declines birth control post partum.  Does have some mild dyspnea on exertion. Denies any chest pain.  ?[facility] ? ?Reports she was told she has a heart mumur and "hole in heart" as a child. Did she cardiology previously but not in the last 5-10 years. Denies any swelling or chest pain today. Reports some shortness of breath. Has never had any surgery. [facility]. She is not sure if she had an echo anytime recently ? ?Dating: By LMP --->  Estimated Date of Delivery: 07/07/21 ? ?Sono:   ? ?_0  1d, CWD, normal anatomy, cephalic presentation,  35732g, 55 % EFW ? ? 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 ? ?Past Medical History: ?Past Medical History:  ?Diagnosis Date  ? Asthma   ? Heart murmur   ? Hypertension   ? Panic attacks   ? ? ?Past Surgical History: ?History reviewed. No pertinent surgical history. ? ?Obstetrical History: ?OB History   ? ? Gravida  ?1  ? Para  ?0  ? Term  ?0  ? Preterm  ?0  ? AB  ?0  ? Living  ?0  ?  ? ? SAB  ?0  ? IAB  ?0  ? Ectopic  ?0  ? Multiple  ?0  ? Live Births  ?0  ?   ?  ?  ? ? ?Social History ?Social History  ? ?Socioeconomic History  ? Marital status: Single  ?  Spouse name: Not on file  ? Number of children: Not on file  ? Years of education: Not on file  ? Highest education level: Not on file  ?Occupational History  ? Not on file  ?Tobacco  Use  ? Smoking status: Former  ?  Types: Cigars  ? Smokeless tobacco: Never  ?Vaping Use  ? Vaping Use: Former  ?Substance and Sexual Activity  ? Alcohol use: Not Currently  ?  Comment: Occasional, not since confirmed pregnancy  ? Drug use: No  ? Sexual activity: Yes  ?  Partners: Male  ?  Birth control/protection: None  ?Other Topics Concern  ? Not on file  ?Social History Narrative  ? Not on file  ? ?Social Determinants of Health  ? ?Financial Resource Strain: Not on file  ?Food Insecurity: Not on file  ?Transportation Needs: Not on file  ?Physical Activity: Not on file  ?Stress: Not on file  ?Social Connections: Not on file  ? ? ?Family History: ?Family History  ?Problem Relation Age of Onset  ? Hypertension Mother   ? Hypertension Father   ? Hypertension Other   ? ? ?Allergies: ?Allergies  ?Allergen Reactions  ? Coconut (Cocos Nucifera) Allergy Skin Test Swelling  ? Ibuprofen Other (See Comments)  ?  "it just won't stay down makes me feel sick and makes my stomach burn"  ? Mushroom  Extract Complex Other (See Comments)  ?  "her body mood just changes"  ? ? ?Pt denies allergies to latex, iodine, or shellfish. ? ?Medications Prior to Admission  ?Medication Sig Dispense Refill Last Dose  ? aspirin EC 81 MG tablet Take 1 tablet (81 mg total) by mouth daily. Take after 12 weeks for prevention of preeclampsia later in pregnancy. 30 tablet 2 07/06/2021  ? cyclobenzaprine (FLEXERIL) 5 MG tablet Take 5 mg by mouth 3 (three) times daily as needed for muscle spasms.   07/06/2021  ? Ipratropium-Albuterol (COMBIVENT) 20-100 MCG/ACT AERS respimat Inhale 1 puff into the lungs every 6 (six) hours as needed for wheezing or shortness of breath. 1 each 3 Past Month  ? Prenatal Vit-Fe Phos-FA-Omega (VITAFOL GUMMIES) 3.33-0.333-34.8 MG CHEW Chew 3 each by mouth daily. 90 tablet 11 07/06/2021  ? promethazine (PHENERGAN) 12.5 MG tablet Take 1 tablet (12.5 mg total) by mouth every 8 (eight) hours as needed for nausea or vomiting. 30 tablet  0 07/06/2021  ? Blood Pressure Monitoring (BLOOD PRESSURE KIT) DEVI 1 kit by Does not apply route once a week. 1 each 0   ? ? ? ?Review of Systems  ? ?All systems reviewed and negative except as stated in HPI ? ?Blood pressure 126/89, pulse 84, temperature 98.4 ?F (36.9 ?C), temperature source Oral, resp. rate 17, height _0  (1.702 m), weight 86.6 kg, last menstrual period 09/30/2020, unknown if currently breastfeeding. ?General appearance: alert, cooperative, and no distress ?Lungs: clear to auscultation bilaterally, no iWOB ?Heart: regular rate and rhythm, faint systolic murmur auscultated in L sternal border ?Abdomen: soft, non-tender; bowel sounds normal ?Extremities: no sign of DVT, no lower extremity edema ?Presentation: cephalic confirmed by BSUS ?Fetal monitoringBaseline: 150 bpm, Variability: Good {> 6 bpm), Accelerations: Reactive, and Decelerations: Absent ?Uterine activity occasional ?Dilation: Fingertip ?Exam by:: Dr Jinny Sanders ? ?Prenatal labs: ?ABO, Rh: --/--/B POS (03/22 1147) ?Antibody: NEG (03/22 1147) ?Rubella: 1.20 (09/26 0947) ?RPR: Non Reactive (12/22 1045)  ?HBsAg: Negative (09/26 0947)  ?HIV: Non Reactive (12/22 1045)  ?GBS: Negative/-- (02/23 1512)  ?2 hr Glucola: 95 nml ?Genetic screening:  AFP negative, horizon negative, panorama LR female ?Anatomy US: Bilateral urinary tract dilations  > resolved at 30 w 0d ? ?Prenatal Transfer Tool  ?Maternal Diabetes: No ?Genetic Screening: Normal ?Maternal Ultrasounds/Referrals: Referred to cardiology however patient was unable to get to appointment d/t transportation. No echo in chart review ?Fetal Ultrasounds or other Referrals:  Referred to Materal Fetal Medicine  for growth scans (last one AGA) Bilateral UTD resolved at 30 week scan ?Maternal Substance Abuse:  No ?Significant Maternal Medications:  None ?Significant Maternal Lab Results: Group B Strep negative ? ?Results for orders placed or performed during the hospital encounter of 07/07/21 (from  the past 24 hour(s))  ?CBC  ? Collection Time: 07/07/21 11:47 AM  ?Result Value Ref Range  ? WBC 10.0 4.0 - 10.5 K/uL  ? RBC 4.06 3.87 - 5.11 MIL/uL  ? Hemoglobin 11.0 (L) 12.0 - 15.0 g/dL  ? HCT 34.4 (L) 36.0 - 46.0 %  ? MCV 84.7 80.0 - 100.0 fL  ? MCH 27.1 26.0 - 34.0 pg  ? MCHC 32.0 30.0 - 36.0 g/dL  ? RDW 13.1 11.5 - 15.5 %  ? Platelets 293 150 - 400 K/uL  ? nRBC 0.0 0.0 - 0.2 %  ?Comprehensive metabolic panel  ? Collection Time: 07/07/21 11:47 AM  ?Result Value Ref Range  ? Sodium 136 135 - 145 mmol/L  ? Potassium  4.3 3.5 - 5.1 mmol/L  ? Chloride 109 98 - 111 mmol/L  ? CO2 16 (L) 22 - 32 mmol/L  ? Glucose, Bld 76 70 - 99 mg/dL  ? BUN 16 6 - 20 mg/dL  ? Creatinine, Ser 0.85 0.44 - 1.00 mg/dL  ? Calcium 9.3 8.9 - 10.3 mg/dL  ? Total Protein 6.5 6.5 - 8.1 g/dL  ? Albumin 3.0 (L) 3.5 - 5.0 g/dL  ? AST 22 15 - 41 U/L  ? ALT 15 0 - 44 U/L  ? Alkaline Phosphatase 121 38 - 126 U/L  ? Total Bilirubin 0.7 0.3 - 1.2 mg/dL  ? GFR, Estimated >60 >60 mL/min  ? Anion gap 11 5 - 15  ?Type and screen  ? Collection Time: 07/07/21 11:47 AM  ?Result Value Ref Range  ? ABO/RH(D) B POS   ? Antibody Screen NEG   ? Sample Expiration    ?  07/10/2021,2359 ?Performed at [facility], [place] [address], [place],  [address] ?  ? ? ?Patient Active Problem List  ? Diagnosis Date Noted  ? Chronic hypertension affecting pregnancy 07/07/2021  ? Heart murmur 02/08/2021  ? Chronic hypertension in obstetric context in second trimester 02/08/2021  ? Maternal obesity affecting pregnancy, antepartum 01/11/2021  ? Supervision of high risk pregnancy, antepartum 12/23/2020  ? ? ?Assessment/Plan:  ?WAFA MARTES is a 26 y.o. G1P0000 at 42w0dhere for IOL d/t cHTN (no home medications and well controlled) ? ?#Labor: Cervix closed to fingertip in external os. IOL starting with cytotec, confirmed vertex on BSUS ?#Pain: PRN medications, epidural very last line per patient  ?#FWB: Cat I ?#ID:  GBS negative ?#MOF: Bottle and breast ?#MOC:  Declines ?#Circ:  Yes ? ?#cHTN ?Asymptomatic. CMP and CBC normal. P/C ratio to be collected. BP has been appropriate here. ? ?#Heart Murmur ?Not on any medications. Faint systolic murmur on examination. DSharma Covert

## 2021-07-07 NOTE — Progress Notes (Signed)
Mackenzie Schultz is a 26 y.o. G1P0000 at [redacted]w[redacted]d by LMP admitted for induction of labor due to [place]. ? ?Subjective: ?Resting, feeling pain with contractions. Would like something to help with sleep. ? ?Objective: ?BP 109/62   Pulse 86   Temp 97.9 ?F (36.6 ?C) (Oral)   Resp 16   Ht 5\' 7"  (1.702 m)   Wt 86.6 kg   LMP 09/30/2020 Comment: negative HCG blood test 10-28-2020  Breastfeeding Unknown   BMI 29.91 kg/m?  ?No intake/output data recorded. ?No intake/output data recorded. ? ?FHT:  FHR: 145 bpm, variability: moderate,  accelerations:  Present,  decelerations:  Absent ?UC:   irregular, every 5-7 minutes ?SVE:   Dilation: Fingertip ?Effacement (%): 20 ?Station: -2 ?Exam by:: [email or web address], MD ? ?Labs: ?Lab Results  ?Component Value Date  ? WBC 10.0 07/07/2021  ? HGB 11.0 (L) 07/07/2021  ? HCT 34.4 (L) 07/07/2021  ? MCV 84.7 07/07/2021  ? PLT 293 07/07/2021  ? ? 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 Progressing with cervical ripening via Cytotec. Cytotect (x3) placed vaginally at 2235. ? ?Labor:  Vaginal Cytotect placed at 2235, 3rd dose. Will plan to recheck in ~4 hours and consider attempting foley balloon at that time.  ?Fetal Wellbeing:  Category I ?Pain Control:  IV pain meds, planning for epidural ?I/D:   GBS Negative ?Anticipated MOD:  NSVD ? ?2236 ?07/07/2021, 10:37 PM ? ? ?

## 2021-07-08 ENCOUNTER — Encounter (HOSPITAL_COMMUNITY): Payer: Self-pay

## 2021-07-08 MED ORDER — MISOPROSTOL 50MCG HALF TABLET
50.0000 ug | ORAL_TABLET | Freq: Once | ORAL | Status: AC
  Administered 2021-07-08: 50 ug via ORAL
  Filled 2021-07-08: qty 1

## 2021-07-08 MED ORDER — TERBUTALINE SULFATE 1 MG/ML IJ SOLN
0.2500 mg | Freq: Once | INTRAMUSCULAR | Status: DC | PRN
Start: 2021-07-08 — End: 2021-07-10

## 2021-07-08 MED ORDER — SODIUM CHLORIDE 0.9 % IV SOLN
6.2500 mg | Freq: Once | INTRAVENOUS | Status: AC
  Administered 2021-07-08: 6.25 mg via INTRAVENOUS
  Filled 2021-07-08 (×2): qty 0.25

## 2021-07-08 NOTE — Progress Notes (Signed)
Mackenzie Schultz is a 26 y.o. G1P0000 at [redacted]w[redacted]d admitted for induction of labor due to [place]. ? ?Subjective: ?Feeling some cramping but fentanyl is helping ? ?Objective: ?BP (!) 139/97   Pulse 79   Temp 98.1 ?F (36.7 ?C) (Oral)   Resp 16   Ht 5\' 7"  (1.702 m)   Wt 86.6 kg   LMP 09/30/2020 Comment: negative HCG blood test 10-28-2020  Breastfeeding Unknown   BMI 29.91 kg/m?  ?No intake/output data recorded. ?No intake/output data recorded. ? ?FHT:  FHR: 145 bpm, variability: moderate,  accelerations:  Present,  decelerations:  Absent ?UC:   regular, every 2-3 minutes ?SVE:   Dilation: Fingertip ?Effacement (%): Thick ?Station: -3 ?Exam by:: Darliss Ridgel RN ? ?Labs: ?Lab Results  ?Component Value Date  ? WBC 10.0 07/07/2021  ? HGB 11.0 (L) 07/07/2021  ? HCT 34.4 (L) 07/07/2021  ? MCV 84.7 07/07/2021  ? PLT 293 07/07/2021  ? ? ?Assessment / Plan: ?G1P0000 at [redacted]w[redacted]d admitted for induction of labor due to [facility]. ? ?Labor:  s/p cytotec x5(last dose ~0825). Speculum guided cooks balloon placed successfully during current check. Tolerated well by fetus and patient.  ? ?Fetal Wellbeing:  Category I ?Pain Control:  IV pain meds ?I/D:   GBS neg ? ? ?#cHTN ?BP mild range (130s/90s). Declines symptoms. Will continue to monitor. ? ?Renard Matter, MD, MPH ?OB Fellow, Faculty Practice ? ? ?

## 2021-07-08 NOTE — Progress Notes (Addendum)
Mackenzie Schultz is a 26 y.o. G1P0000 at [redacted]w[redacted]d by LMP admitted for induction of labor due to [place]. ? ?Subjective: ?Foley bulb still in place ? ?Objective: ?BP 123/63   Pulse 74   Temp 97.8 ?F (36.6 ?C) (Oral)   Resp 16   Ht 5\' 7"  (1.702 m)   Wt 86.6 kg   LMP 09/30/2020 Comment: negative HCG blood test 10-28-2020  Breastfeeding Unknown   BMI 29.91 kg/m?  ?No intake/output data recorded. ?No intake/output data recorded. ? ?FHT:  FHR: 150 bpm, variability: moderate,  accelerations:  Present,  decelerations:  some late decelerations ?UC:   irregular, every 2-3 minutes ?SVE:   Dilation: Fingertip ?Effacement (%): Thick ?Station: -3 ?Exam by:: [email or web address] RN ? ?Labs: ?Lab Results  ?Component Value Date  ? WBC 10.0 07/07/2021  ? HGB 11.0 (L) 07/07/2021  ? HCT 34.4 (L) 07/07/2021  ? MCV 84.7 07/07/2021  ? PLT 293 07/07/2021  ? ? 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 Cervical ripening via Cytotec x 5, have  ? ?Labor:  Cytotec dose 0825, is dilating and effacing around FB -plan for low dose pitocin (held off during day d/t frequent contractions) will monitor closely ?Fetal Wellbeing:  Category I some decelerations overall reassuring ?Pain Control:  IV pain meds, planning for epidural ?I/D:   GBS Negative ?Anticipated MOD:  NSVD ? ?#cHTN ?BP has been mild range here. Asx ? ?Tyshell Ramberg ?07/08/2021, 5:44 PM ? ? ?

## 2021-07-08 NOTE — Progress Notes (Signed)
Mackenzie Schultz is a 26 y.o. G1P0000 at [redacted]w[redacted]d by LMP admitted for induction of labor due to [place]. ? ?Subjective: ?Feeling  some pain with contractions.  ? ?Objective: ?BP (!) 139/97   Pulse 79   Temp 98.1 ?F (36.7 ?C) (Oral)   Resp 16   Ht 5\' 7"  (1.702 m)   Wt 86.6 kg   LMP 09/30/2020 Comment: negative HCG blood test 10-28-2020  Breastfeeding Unknown   BMI 29.91 kg/m?  ?No intake/output data recorded. ?No intake/output data recorded. ? ?FHT:  FHR: 150 bpm, variability: moderate,  accelerations:  Present,  decelerations:  Absent ?UC:   irregular, every 2-3 minutes ?SVE:   Dilation: Fingertip ?Effacement (%): Thick ?Station: -3 ?Exam by:: [email or web address] RN ? ?Labs: ?Lab Results  ?Component Value Date  ? WBC 10.0 07/07/2021  ? HGB 11.0 (L) 07/07/2021  ? HCT 34.4 (L) 07/07/2021  ? MCV 84.7 07/07/2021  ? PLT 293 07/07/2021  ? ? 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 Progressing with cervical ripening via Cytotec x 5.  ? ?Labor:  Cytotec dose 0825; foley balloon placed with 60 cc water by speculum by Dr. 07/09/2021 [phone]  ?Fetal Wellbeing:  Category I ?Pain Control:  IV pain meds, planning for epidural ?I/D:   GBS Negative ?Anticipated MOD:  NSVD ? ?#cHTN ?BP has been mild range here. Asx ? ?Mackenzie Schultz ?07/08/2021, 9:50 AM ? ? ?

## 2021-07-08 NOTE — Progress Notes (Signed)
Labor Progress Note ?Mackenzie Schultz is a 26 y.o. G1P0000 at [redacted]w[redacted]d who presented for IOL due to [place].  ? ?S: Doing well. Small amount of bleeding noted by RN after tugging on foley balloon. Remains in place. No other concerns at this time.  ? ?O:  ?BP 128/77   Pulse 66   Temp 98.5 ?F (36.9 ?C) (Oral)   Resp 16   Ht 5\' 7"  (1.702 m)   Wt 86.6 kg   LMP 09/30/2020 Comment: negative HCG blood test 10-28-2020  Breastfeeding Unknown   BMI 29.91 kg/m?  ? ?EFM: Baseline 155 bpm, moderate variability, + accels, no decels  ?Toco: Irregular contractions  ? ?CVE: Dilation: Fingertip ?Effacement (%): Thick ?Cervical Position: Middle ?Station: -3 ?Presentation: Vertex ?Exam by:: [email or web address] RN ? ?A&P: 26 y.o. G1P0000 [redacted]w[redacted]d   ? ?#Labor: Progressing. Foley balloon remains in place. Started on low dose Pitocin this afternoon. Will max at 8 milli-units/min while balloon is in place. Will reassess in 4 hours.  ?#Pain: PRN ?#FWB: Cat 1  ?#GBS negative ? ?#cHTN: BP controlled, normal to mild range. No symptoms. Will continue to monitor.  ? ?[redacted]w[redacted]d, MD ?10:19 PM ? ?

## 2021-07-08 NOTE — Progress Notes (Signed)
Patient ID: Mackenzie Schultz, female   DOB: 05-13-1995, 26 y.o.   MRN: [id number] ? ?S/p cytotec x 3 doses; feeling some cramping ? ?BPs 125/86, 112/60 ?FHR 140s, +accels, no decels, Cat 1 ?Ctx q 1.5-7 mins ?Cx dimple/50%/vtx -2; vtx confirmed by bedside u/s ? ?IUP@40 .1wks ?cHTN ?Cx unfavorable ? ?Cervical foley placement attempted without success ?2nd dose of vaginal cytotec placed (4th dose overall) ?Will attempt foley later in the morning ? ?Arabella Merles CNM ?07/08/2021 ?3:48 AM ? ?

## 2021-07-09 ENCOUNTER — Inpatient Hospital Stay (HOSPITAL_COMMUNITY): Payer: Medicaid Other | Admitting: Anesthesiology

## 2021-07-09 ENCOUNTER — Encounter (HOSPITAL_COMMUNITY): Payer: Self-pay | Admitting: Obstetrics and Gynecology

## 2021-07-09 DIAGNOSIS — O9902 Anemia complicating childbirth: Secondary | ICD-10-CM

## 2021-07-09 DIAGNOSIS — Z3A40 40 weeks gestation of pregnancy: Secondary | ICD-10-CM

## 2021-07-09 DIAGNOSIS — D649 Anemia, unspecified: Secondary | ICD-10-CM

## 2021-07-09 LAB — CBC
HCT: 31.5 % — ABNORMAL LOW (ref 36.0–46.0)
Hemoglobin: 10.3 g/dL — ABNORMAL LOW (ref 12.0–15.0)
MCH: 27.2 pg (ref 26.0–34.0)
MCHC: 32.7 g/dL (ref 30.0–36.0)
MCV: 83.3 fL (ref 80.0–100.0)
Platelets: 280 10*3/uL (ref 150–400)
RBC: 3.78 MIL/uL — ABNORMAL LOW (ref 3.87–5.11)
RDW: 13.2 % (ref 11.5–15.5)
WBC: 16.4 10*3/uL — ABNORMAL HIGH (ref 4.0–10.5)
nRBC: 0 % (ref 0.0–0.2)

## 2021-07-09 MED ORDER — LIDOCAINE HCL (PF) 1 % IJ SOLN
INTRAMUSCULAR | Status: DC | PRN
  Administered 2021-07-09 (×2): 5 mL via EPIDURAL

## 2021-07-09 MED ORDER — DIPHENHYDRAMINE HCL 50 MG/ML IJ SOLN: 12.5000 mg | INTRAMUSCULAR | Status: DC | PRN

## 2021-07-09 MED ORDER — PHENYLEPHRINE 40 MCG/ML (10ML) SYRINGE FOR IV PUSH (FOR BLOOD PRESSURE SUPPORT): 80.0000 ug | PREFILLED_SYRINGE | INTRAVENOUS | Status: DC | PRN

## 2021-07-09 MED ORDER — OXYTOCIN-SODIUM CHLORIDE 30-0.9 UT/500ML-% IV SOLN: 1.0000 m[IU]/min | INTRAVENOUS | Status: DC

## 2021-07-09 MED ORDER — EPHEDRINE 5 MG/ML INJ: 10.0000 mg | INTRAVENOUS | Status: DC | PRN

## 2021-07-09 MED ORDER — LACTATED RINGERS IV SOLN: 500.0000 mL | Freq: Once | INTRAVENOUS | Status: DC

## 2021-07-09 MED ORDER — FENTANYL-BUPIVACAINE-NACL 0.5-0.125-0.9 MG/250ML-% EP SOLN
12.0000 mL/h | EPIDURAL | Status: DC | PRN
  Administered 2021-07-09: 12 mL/h via EPIDURAL
  Filled 2021-07-09: qty 250

## 2021-07-09 NOTE — Anesthesia Preprocedure Evaluation (Addendum)
Anesthesia Evaluation  ?Patient identified by MRN, date of birth, ID band ?Patient awake ? ? ? ?Reviewed: ?Allergy & Precautions, NPO status , Patient's Chart, lab work & pertinent test results ? ?Airway ?Mallampati: II ? ?TM Distance: >3 FB ?Neck ROM: Full ? ? ? Dental ? ?(+) Teeth Intact, Dental Advisory Given ?  ?Pulmonary ?asthma , former smoker,  ?  ?Pulmonary exam normal ?breath sounds clear to auscultation ? ? ? ? ? ? Cardiovascular ?hypertension, Normal cardiovascular exam ?Rhythm:Regular Rate:Normal ? ?CHTN, not currently on any Rx ?  ?Neuro/Psych ?Anxiety Hx/o Panic attacks  ? GI/Hepatic ?Neg liver ROS, GERD  ,  ?Endo/Other  ?negative endocrine ROS ? Renal/GU ?negative Renal ROS  ?negative genitourinary ?  ?Musculoskeletal ?negative musculoskeletal ROS ?(+)  ? Abdominal ?  ?Peds ? Hematology ? ?(+) Blood dyscrasia, anemia ,   ?Anesthesia Other Findings ? ? Reproductive/Obstetrics ?(+) Pregnancy ? ?  ? ? ? ? ? ? ? ? ? ? ? ? ? ?  ?  ? ? ? ? ? ? ? ?Anesthesia Physical ?Anesthesia Plan ? ?ASA: 2 ? ?Anesthesia Plan: Epidural  ? ?Post-op Pain Management:   ? ?Induction:  ? ?PONV Risk Score and Plan:  ? ?Airway Management Planned: Natural Airway ? ?Additional Equipment:  ? ?Intra-op Plan:  ? ?Post-operative Plan:  ? ?Informed Consent: I have reviewed the patients History and Physical, chart, labs and discussed the procedure including the risks, benefits and alternatives for the proposed anesthesia with the patient or authorized representative who has indicated his/her understanding and acceptance.  ? ? ? ? ? ?Plan Discussed with: Anesthesiologist ? ?Anesthesia Plan Comments: (C-section called by Dr. Damita Dunnings. Will plan to dose epidural for surgical anesthesia. GETA as backup plan. Norton Blizzard, MD  ?)  ? ? ? ? ? ?Anesthesia Quick Evaluation ? ?

## 2021-07-09 NOTE — Progress Notes (Signed)
Labor Progress Note ?Mackenzie Schultz is a 26 y.o. G1P0000 at [redacted]w[redacted]d who presented for IOL due to [place]. ? ?S: Resting comfortably. No concerns at this time.  ? ?O:  ?BP 136/79   Pulse 71   Temp 97.9 ?F (36.6 ?C) (Axillary)   Resp 16   Ht 5\' 7"  (1.702 m)   Wt 86.6 kg   LMP 09/30/2020 Comment: negative HCG blood test 10-28-2020  SpO2 100%   Breastfeeding Unknown   BMI 29.91 kg/m?  ? ?EFM: Baseline 155 bpm, moderate variability, + accels, intermittent late decels ?Toco: Every 2-3 minutes, MVUs adequate  ? ?CVE: Dilation: 4 ?Effacement (%): 80 ?Cervical Position: Middle ?Station: -2 ?Presentation: Vertex ?Exam by:: [place], CNM ? ? ?A&P: 26 y.o. G1P0000 [redacted]w[redacted]d  ? ?#Labor: Progressing. Increasing Pitocin as tolerated, just now achieving adequate pattern per IUPC. Will continue to monitor with plan to reassess 4-5 hours after adequate contraction pattern.  ?#Pain: Epidural  ?#FWB: Cat 2 due to intermittent late decels. Improves with position changes and sometimes just spontaneously resolves. Continues to maintain reassuring variability and accels. Will continue to monitor closely.  ?#GBS negative ? ?#cHTN: BP stable. Continue to monitor.  ? ?[redacted]w[redacted]d, MD ?10:06 PM ? ?

## 2021-07-09 NOTE — Progress Notes (Signed)
Labor Progress Note ?[name] is a 26 y.o. G1P0000 at [redacted]w[redacted]d who presented for IOL due to [place].  ? ?S: Foley balloon now out. Reports some of her pain improved with this. Continues to have lower belly pain that radiates to her back with contractions. No other concerns at this time.  ? ?O:  ?BP 117/66   Pulse 76   Temp 97.9 ?F (36.6 ?C) (Oral)   Resp 16   Ht 5\' 7"  (1.702 m)   Wt 86.6 kg   LMP 09/30/2020 Comment: negative HCG blood test 10-28-2020  Breastfeeding Unknown   BMI 29.91 kg/m?  ? ?EFM: Baseline 155 bpm, moderate variability, + accels, no decels  ?Toco: Every 2-7 minutes  ? ?CVE: Dilation: 3.5 ?Effacement (%): 50 ?Cervical Position: Middle ?Station: -3 ?Presentation: Vertex ?Exam by:: [email or web address], MD ? ?A&P: 26 y.o. G1P0000 [redacted]w[redacted]d  ? ?#Labor: Progressing well s/p foley balloon. Contracting frequently and concern for high resting tone on exam. FHT in the 180s-190s with intermittent decelerations. Pitocin discontinued. Recommended AROM with IUPC placement for improved monitoring. Patient verbally consented. AROM performed with clear fluid. IUPC placed without difficulty. Adequate MVUs initially, somewhat spaced now. FHT improved with baseline now in the 150s. Will allow for fetal recovery for about 30-45 minutes, then plan to restart Pitocin at 2 milli-units/min. Will continue to monitor.  ?#Pain: IV Fentanyl for now  ?#FWB: Cat 1 ?#GBS negative ? ?#cHTN: BP normal range. Will continue to monitor.  ? ?[redacted]w[redacted]d, MD ?3:44 AM ? ?

## 2021-07-09 NOTE — Progress Notes (Addendum)
LABOR PROGRESS NOTE ? ?Subjective: Patient is resting at bs with family present for support. Pt grimacing with CTXs but does not report pressure.  ? ?Objective: ?Blood pressure (!) 134/107, pulse (!) 130, temperature (!) 97.5 ?F (36.4 ?C), temperature source Oral, resp. rate 17, height 5\' 7"  (1.702 m), weight 86.6 kg, last menstrual period 09/30/2020, unknown if currently breastfeeding. ?Temp (24hrs), Avg:98 ?F (36.7 ?C), Min:97.5 ?F (36.4 ?C), Max:98.5 ?F (36.9 ?C) ? ? ?Fetal Heart Tones ?Baseline: 145 ?Variability: moderate ?Accelerations: present; 10x10 ?Decelerations: present; variable; early ?Overall assessment: overall cat 2 ? ?Contractions: 4-5 min ? ?Cervical Exam: 3/60-70/-2 per RN at last SVE  ? ?ASSESSMENT AND PLAN:   ?IUP at [redacted]w[redacted]d in early labor ?Continue current management with pitocin as tolerated ?Epidural PRN ?Recheck following epidural placement  ? ?Electronically Signed By: ?[redacted]w[redacted]d, SNM ?07/09/21 1:38 PM   ? ? ?Attestation of CNM Supervision of midwife student: Evaluation and management procedures were performed by the midwife student under my supervision. I was immediately available for direct supervision, assistance and direction throughout this encounter.  I also confirm that I have verified the information documented in the resident?s note, and that I have also personally reperformed the pertinent components of the physical exam and all of the medical decision making activities.  I have also made any necessary editorial changes. ? ? ?Patient currently in line for epidural placement. Will reassess once comfortable with epidural.  ?Tracing overall reassuring with occasional early vs late decel ? ? ?07/11/21, CNM ?07/09/2021 1:55 PM ? ?

## 2021-07-09 NOTE — Progress Notes (Addendum)
LABOR PROGRESS NOTE ? ?Subjective: Patient comfortable at bs. Does not report pressure or discomfort with CTXs. FM normal, no vaginal bleeding.  ? ?Objective: ?Blood pressure (!) 127/38, pulse 73, temperature 98.4 ?F (36.9 ?C), temperature source Oral, resp. rate 16, height 5\' 7"  (1.702 m), weight 86.6 kg, last menstrual period 09/30/2020, SpO2 100 %, unknown if currently breastfeeding. ?Temp (24hrs), Avg:98 ?F (36.7 ?C), Min:97.3 ?F (36.3 ?C), Max:98.5 ?F (36.9 ?C) ? ? ?Fetal Heart Tones ?Baseline: 150 ?Variability: moderate ?Accelerations: present; 15x15 ?Decelerations: present; late ?Overall assessment: cat 3 ? ?Contractions: 2.5-3.5 ? ?Cervical Exam: 4/80%/-2  ? ?ASSESSMENT AND PLAN:   ?IUP at [redacted]w[redacted]d in latent labor pattern ? ?Pause pitocin titration as indicated by FHTs ?Perform intrauterine resuscitative measures as indicated.  ?SVE PRN ?Encourage frequent position changes ? ?Electronically Signed By: ?[redacted]w[redacted]d, SNM ?07/09/21 6:58 PM   ? ? ?Attestation of CNM Supervision of midwife student: Evaluation and management procedures were performed by the midwife student under my supervision. I was immediately available for direct supervision, assistance and direction throughout this encounter.  I also confirm that I have verified the information documented in the resident?s note, and that I have also personally reperformed the pertinent components of the physical exam and all of the medical decision making activities.  I have also made any necessary editorial changes. ? ? ?Cervix unchanged from previous exam. Fetal head feels asynclitic. Cat II tracing. Variability moderate with occasional late decels, which resolve with position changes/IVF bolus. Encourage frequent position changes to resolve decelerations and asynclitism. Patient currently in high fowler's. MVU's not adequate, 150s-180s. Titrate pitocin prn as tolerated by fetal status.   ? ? ?07/11/21, CNM ?07/09/2021 7:05 PM ? ? ?

## 2021-07-09 NOTE — Anesthesia Procedure Notes (Signed)
Epidural ?Patient location during procedure: OB ?Start time: 07/09/2021 1:56 PM ?End time: 07/09/2021 2:07 PM ? ?Staffing ?Anesthesiologist: Mal Amabile, MD ?Performed: anesthesiologist  ? ?Preanesthetic Checklist ?Completed: patient identified, IV checked, site marked, risks and benefits discussed, surgical consent, monitors and equipment checked, pre-op evaluation and timeout performed ? ?Epidural ?Patient position: sitting ?Prep: DuraPrep and site prepped and draped ?Patient monitoring: continuous pulse ox and blood pressure ?Approach: midline ?Location: L3-L4 ?Injection technique: LOR saline ? ?Needle:  ?Needle type: Tuohy  ?Needle gauge: 17 G ?Needle length: 9 cm and 9 ?Needle insertion depth: 6 cm ?Catheter type: closed end flexible ?Catheter size: 19 Gauge ?Catheter at skin depth: 11 cm ?Test dose: negative and Other ? ?Assessment ?Events: blood not aspirated, injection not painful, no injection resistance, no paresthesia and negative IV test ? ?Additional Notes ?Patient identified. Risks and benefits discussed including failed block, incomplete  ?Pain control, post dural puncture headache, nerve damage, paralysis, blood pressure ?Changes, nausea, vomiting, reactions to medications-both toxic and allergic and post ?Partum back pain. All questions were answered. Patient expressed understanding and wished to proceed. Sterile technique was used throughout procedure. Epidural site was ?Dressed with sterile barrier dressing. No paresthesias, signs of intravascular injection ?Or signs of intrathecal spread were encountered. Attempt x 2 ?Patient was more comfortable after the epidural was dosed. ?Please see RN's note for documentation of vital signs and FHR which are stable. ?Reason for block:procedure for pain ? ? ? ?

## 2021-07-10 ENCOUNTER — Encounter (HOSPITAL_COMMUNITY): Payer: Self-pay | Admitting: Obstetrics and Gynecology

## 2021-07-10 ENCOUNTER — Encounter (HOSPITAL_COMMUNITY)
Admission: AD | Disposition: A | Discharge status: Home / Self Care | Payer: Self-pay | Source: Home / Self Care | Attending: Obstetrics and Gynecology

## 2021-07-10 DIAGNOSIS — Z3A40 40 weeks gestation of pregnancy: Secondary | ICD-10-CM

## 2021-07-10 DIAGNOSIS — D649 Anemia, unspecified: Secondary | ICD-10-CM

## 2021-07-10 DIAGNOSIS — O1092 Unspecified pre-existing hypertension complicating childbirth: Secondary | ICD-10-CM

## 2021-07-10 DIAGNOSIS — O9902 Anemia complicating childbirth: Secondary | ICD-10-CM

## 2021-07-10 LAB — TYPE AND SCREEN
ABO/RH(D): B POS
Antibody Screen: NEGATIVE

## 2021-07-10 LAB — CBC WITH DIFFERENTIAL/PLATELET
Abs Immature Granulocytes: 0.18 10*3/uL — ABNORMAL HIGH (ref 0.00–0.07)
Basophils Absolute: 0 10*3/uL (ref 0.0–0.1)
Basophils Relative: 0 %
Eosinophils Absolute: 0.8 10*3/uL — ABNORMAL HIGH (ref 0.0–0.5)
Eosinophils Relative: 4 %
HCT: 29.2 % — ABNORMAL LOW (ref 36.0–46.0)
Hemoglobin: 9.2 g/dL — ABNORMAL LOW (ref 12.0–15.0)
Immature Granulocytes: 1 %
Lymphocytes Relative: 2 %
Lymphs Abs: 0.5 10*3/uL — ABNORMAL LOW (ref 0.7–4.0)
MCH: 26.7 pg (ref 26.0–34.0)
MCHC: 31.5 g/dL (ref 30.0–36.0)
MCV: 84.9 fL (ref 80.0–100.0)
Monocytes Absolute: 0.6 10*3/uL (ref 0.1–1.0)
Monocytes Relative: 3 %
Neutro Abs: 18.3 10*3/uL — ABNORMAL HIGH (ref 1.7–7.7)
Neutrophils Relative %: 90 %
Platelets: 228 10*3/uL (ref 150–400)
RBC: 3.44 MIL/uL — ABNORMAL LOW (ref 3.87–5.11)
RDW: 13.2 % (ref 11.5–15.5)
Smear Review: ADEQUATE
WBC Morphology: INCREASED
WBC: 20.3 10*3/uL — ABNORMAL HIGH (ref 4.0–10.5)
nRBC: 0 % (ref 0.0–0.2)

## 2021-07-10 SURGERY — Surgical Case
Anesthesia: Epidural

## 2021-07-10 MED ORDER — SIMETHICONE 80 MG PO CHEW: 80.0000 mg | CHEWABLE_TABLET | ORAL | Status: DC | PRN

## 2021-07-10 MED ORDER — NALOXONE HCL 0.4 MG/ML IJ SOLN: 0.4000 mg | INTRAMUSCULAR | Status: DC | PRN

## 2021-07-10 MED ORDER — ENOXAPARIN SODIUM 40 MG/0.4ML IJ SOSY
40.0000 mg | PREFILLED_SYRINGE | INTRAMUSCULAR | Status: DC
  Administered 2021-07-10 – 2021-07-12 (×3): 40 mg via SUBCUTANEOUS
  Filled 2021-07-10 (×3): qty 0.4

## 2021-07-10 MED ORDER — COCONUT OIL OIL: 1.0000 "application " | TOPICAL_OIL | Status: DC | PRN

## 2021-07-10 MED ORDER — DEXAMETHASONE SODIUM PHOSPHATE 10 MG/ML IJ SOLN
INTRAMUSCULAR | Status: DC | PRN
  Administered 2021-07-10: 10 mg via INTRAVENOUS

## 2021-07-10 MED ORDER — FENTANYL CITRATE (PF) 100 MCG/2ML IJ SOLN
INTRAMUSCULAR | Status: DC | PRN
  Administered 2021-07-10 (×2): 50 ug via EPIDURAL

## 2021-07-10 MED ORDER — MORPHINE SULFATE (PF) 0.5 MG/ML IJ SOLN
INTRAMUSCULAR | Status: AC
  Filled 2021-07-10: qty 10

## 2021-07-10 MED ORDER — SENNOSIDES-DOCUSATE SODIUM 8.6-50 MG PO TABS
2.0000 | ORAL_TABLET | Freq: Every day | ORAL | Status: DC
  Administered 2021-07-11 – 2021-07-13 (×3): 2 via ORAL
  Filled 2021-07-10 (×3): qty 2

## 2021-07-10 MED ORDER — ONDANSETRON HCL 4 MG/2ML IJ SOLN
INTRAMUSCULAR | Status: AC
  Filled 2021-07-10: qty 2

## 2021-07-10 MED ORDER — MENTHOL 3 MG MT LOZG: 1.0000 | LOZENGE | OROMUCOSAL | Status: DC | PRN

## 2021-07-10 MED ORDER — KETOROLAC TROMETHAMINE 30 MG/ML IJ SOLN
INTRAMUSCULAR | Status: AC
Start: 2021-07-10 — End: ?
  Filled 2021-07-10: qty 1

## 2021-07-10 MED ORDER — OXYCODONE HCL 5 MG PO TABS
5.0000 mg | ORAL_TABLET | ORAL | Status: DC | PRN
  Administered 2021-07-10: 5 mg via ORAL
  Administered 2021-07-11 – 2021-07-12 (×2): 10 mg via ORAL
  Filled 2021-07-10: qty 2
  Filled 2021-07-10 (×3): qty 1
  Filled 2021-07-10: qty 2

## 2021-07-10 MED ORDER — NIFEDIPINE ER OSMOTIC RELEASE 30 MG PO TB24
30.0000 mg | ORAL_TABLET | Freq: Every day | ORAL | Status: DC
  Administered 2021-07-10 – 2021-07-13 (×4): 30 mg via ORAL
  Filled 2021-07-10 (×4): qty 1

## 2021-07-10 MED ORDER — MORPHINE SULFATE (PF) 0.5 MG/ML IJ SOLN
INTRAMUSCULAR | Status: DC | PRN
Start: 2021-07-10 — End: 2021-07-10
  Administered 2021-07-10: 2 mg via INTRAVENOUS

## 2021-07-10 MED ORDER — OXYCODONE HCL 5 MG/5ML PO SOLN: 5.0000 mg | Freq: Once | ORAL | Status: DC | PRN

## 2021-07-10 MED ORDER — DEXMEDETOMIDINE (PRECEDEX) IN NS 20 MCG/5ML (4 MCG/ML) IV SYRINGE
PREFILLED_SYRINGE | INTRAVENOUS | Status: DC | PRN
  Administered 2021-07-10: 8 ug via INTRAVENOUS

## 2021-07-10 MED ORDER — SIMETHICONE 80 MG PO CHEW
80.0000 mg | CHEWABLE_TABLET | Freq: Three times a day (TID) | ORAL | Status: DC
  Administered 2021-07-10 – 2021-07-13 (×8): 80 mg via ORAL
  Filled 2021-07-10 (×8): qty 1

## 2021-07-10 MED ORDER — BUPIVACAINE HCL (PF) 0.5 % IJ SOLN
INTRAMUSCULAR | Status: AC
  Filled 2021-07-10: qty 30

## 2021-07-10 MED ORDER — OXYTOCIN-SODIUM CHLORIDE 30-0.9 UT/500ML-% IV SOLN
2.5000 [IU]/h | INTRAVENOUS | Status: AC
  Administered 2021-07-10 (×2): 2.5 [IU]/h via INTRAVENOUS
  Filled 2021-07-10: qty 500

## 2021-07-10 MED ORDER — BUPIVACAINE HCL (PF) 0.5 % IJ SOLN
INTRAMUSCULAR | Status: DC | PRN
  Administered 2021-07-10: 10 mL

## 2021-07-10 MED ORDER — ONDANSETRON HCL 4 MG/2ML IJ SOLN: 4.0000 mg | Freq: Once | INTRAMUSCULAR | Status: DC | PRN

## 2021-07-10 MED ORDER — ONDANSETRON HCL 4 MG/2ML IJ SOLN
INTRAMUSCULAR | Status: DC | PRN
  Administered 2021-07-10: 4 mg via INTRAVENOUS

## 2021-07-10 MED ORDER — TRANEXAMIC ACID-NACL 1000-0.7 MG/100ML-% IV SOLN
INTRAVENOUS | Status: AC
  Filled 2021-07-10: qty 100

## 2021-07-10 MED ORDER — LACTATED RINGERS IV SOLN: INTRAVENOUS | Status: DC

## 2021-07-10 MED ORDER — KETOROLAC TROMETHAMINE 30 MG/ML IJ SOLN
30.0000 mg | Freq: Four times a day (QID) | INTRAMUSCULAR | Status: AC | PRN
  Administered 2021-07-10: 30 mg via INTRAVENOUS

## 2021-07-10 MED ORDER — FENTANYL CITRATE (PF) 100 MCG/2ML IJ SOLN
INTRAMUSCULAR | Status: AC
  Filled 2021-07-10: qty 2

## 2021-07-10 MED ORDER — KETOROLAC TROMETHAMINE 30 MG/ML IJ SOLN: 30.0000 mg | Freq: Four times a day (QID) | INTRAMUSCULAR | Status: AC | PRN

## 2021-07-10 MED ORDER — MEPERIDINE HCL 25 MG/ML IJ SOLN: 6.2500 mg | INTRAMUSCULAR | Status: DC | PRN

## 2021-07-10 MED ORDER — FENTANYL CITRATE (PF) 100 MCG/2ML IJ SOLN
25.0000 ug | INTRAMUSCULAR | Status: DC | PRN
  Administered 2021-07-10: 50 ug via INTRAVENOUS
  Administered 2021-07-10: 25 ug via INTRAVENOUS

## 2021-07-10 MED ORDER — NALOXONE HCL 4 MG/10ML IJ SOLN
1.0000 ug/kg/h | INTRAVENOUS | Status: DC | PRN
  Filled 2021-07-10: qty 5

## 2021-07-10 MED ORDER — OXYTOCIN-SODIUM CHLORIDE 30-0.9 UT/500ML-% IV SOLN
INTRAVENOUS | Status: AC
  Filled 2021-07-10: qty 500

## 2021-07-10 MED ORDER — ACETAMINOPHEN 10 MG/ML IV SOLN
1000.0000 mg | Freq: Once | INTRAVENOUS | Status: DC | PRN
  Administered 2021-07-10: 1000 mg via INTRAVENOUS

## 2021-07-10 MED ORDER — SODIUM CHLORIDE 0.9% FLUSH: 3.0000 mL | INTRAVENOUS | Status: DC | PRN

## 2021-07-10 MED ORDER — SODIUM CHLORIDE 0.9 % IV SOLN
500.0000 mg | INTRAVENOUS | Status: AC
  Administered 2021-07-10: 500 mg via INTRAVENOUS

## 2021-07-10 MED ORDER — ONDANSETRON HCL 4 MG/2ML IJ SOLN: 4.0000 mg | Freq: Three times a day (TID) | INTRAMUSCULAR | Status: DC | PRN

## 2021-07-10 MED ORDER — CEFAZOLIN SODIUM-DEXTROSE 2-3 GM-%(50ML) IV SOLR
INTRAVENOUS | Status: DC | PRN
  Administered 2021-07-10: 2 g via INTRAVENOUS

## 2021-07-10 MED ORDER — ACETAMINOPHEN 10 MG/ML IV SOLN
INTRAVENOUS | Status: AC
Start: 2021-07-10 — End: ?
  Filled 2021-07-10: qty 100

## 2021-07-10 MED ORDER — DIPHENHYDRAMINE HCL 25 MG PO CAPS: 25.0000 mg | ORAL_CAPSULE | Freq: Four times a day (QID) | ORAL | Status: DC | PRN

## 2021-07-10 MED ORDER — ACETAMINOPHEN 500 MG PO TABS
1000.0000 mg | ORAL_TABLET | Freq: Four times a day (QID) | ORAL | Status: DC
  Administered 2021-07-10 – 2021-07-13 (×11): 1000 mg via ORAL
  Filled 2021-07-10 (×11): qty 2

## 2021-07-10 MED ORDER — SCOPOLAMINE 1 MG/3DAYS TD PT72
1.0000 | MEDICATED_PATCH | TRANSDERMAL | Status: DC
  Administered 2021-07-10: 1.5 mg via TRANSDERMAL

## 2021-07-10 MED ORDER — OXYCODONE HCL 5 MG PO TABS: 5.0000 mg | ORAL_TABLET | Freq: Once | ORAL | Status: DC | PRN

## 2021-07-10 MED ORDER — SOD CITRATE-CITRIC ACID 500-334 MG/5ML PO SOLN: 30.0000 mL | ORAL | Status: DC

## 2021-07-10 MED ORDER — WITCH HAZEL-GLYCERIN EX PADS: 1.0000 "application " | MEDICATED_PAD | CUTANEOUS | Status: DC | PRN

## 2021-07-10 MED ORDER — DIPHENHYDRAMINE HCL 25 MG PO CAPS: 25.0000 mg | ORAL_CAPSULE | ORAL | Status: DC | PRN

## 2021-07-10 MED ORDER — ACETAMINOPHEN 500 MG PO TABS: 1000.0000 mg | ORAL_TABLET | Freq: Four times a day (QID) | ORAL | Status: DC

## 2021-07-10 MED ORDER — DIBUCAINE (PERIANAL) 1 % EX OINT: 1.0000 "application " | TOPICAL_OINTMENT | CUTANEOUS | Status: DC | PRN

## 2021-07-10 MED ORDER — CEFAZOLIN SODIUM-DEXTROSE 2-4 GM/100ML-% IV SOLN: 2.0000 g | INTRAVENOUS | Status: DC

## 2021-07-10 MED ORDER — MORPHINE SULFATE (PF) 0.5 MG/ML IJ SOLN
INTRAMUSCULAR | Status: DC | PRN
  Administered 2021-07-10: 3 mg via EPIDURAL

## 2021-07-10 MED ORDER — LIDOCAINE-EPINEPHRINE 2 %-1:200000 IJ SOLN
INTRAMUSCULAR | Status: AC
Start: 2021-07-10 — End: ?
  Filled 2021-07-10: qty 20

## 2021-07-10 MED ORDER — PHENYLEPHRINE 40 MCG/ML (10ML) SYRINGE FOR IV PUSH (FOR BLOOD PRESSURE SUPPORT)
PREFILLED_SYRINGE | INTRAVENOUS | Status: AC
  Filled 2021-07-10: qty 10

## 2021-07-10 MED ORDER — DEXAMETHASONE SODIUM PHOSPHATE 10 MG/ML IJ SOLN
INTRAMUSCULAR | Status: AC
  Filled 2021-07-10: qty 1

## 2021-07-10 MED ORDER — PRENATAL MULTIVITAMIN CH
1.0000 | ORAL_TABLET | Freq: Every day | ORAL | Status: DC
  Administered 2021-07-10 – 2021-07-12 (×3): 1 via ORAL
  Filled 2021-07-10 (×3): qty 1

## 2021-07-10 MED ORDER — LIDOCAINE-EPINEPHRINE (PF) 2 %-1:200000 IJ SOLN
INTRAMUSCULAR | Status: AC
  Filled 2021-07-10: qty 20

## 2021-07-10 MED ORDER — FENTANYL CITRATE (PF) 100 MCG/2ML IJ SOLN
INTRAMUSCULAR | Status: AC
Start: 2021-07-10 — End: ?
  Filled 2021-07-10: qty 2

## 2021-07-10 MED ORDER — DIPHENHYDRAMINE HCL 50 MG/ML IJ SOLN: 12.5000 mg | INTRAMUSCULAR | Status: DC | PRN

## 2021-07-10 MED ORDER — OXYTOCIN-SODIUM CHLORIDE 30-0.9 UT/500ML-% IV SOLN
INTRAVENOUS | Status: DC | PRN
  Administered 2021-07-10: 30 [IU] via INTRAVENOUS

## 2021-07-10 MED ORDER — TRANEXAMIC ACID-NACL 1000-0.7 MG/100ML-% IV SOLN
INTRAVENOUS | Status: DC | PRN
  Administered 2021-07-10: 1000 mg via INTRAVENOUS

## 2021-07-10 MED ORDER — SCOPOLAMINE 1 MG/3DAYS TD PT72
MEDICATED_PATCH | TRANSDERMAL | Status: AC
Start: 2021-07-10 — End: ?
  Filled 2021-07-10: qty 1

## 2021-07-10 SURGICAL SUPPLY — 32 items
APL SKNCLS STERI-STRIP NONHPOA (GAUZE/BANDAGES/DRESSINGS) ×1
BENZOIN TINCTURE PRP APPL 2/3 (GAUZE/BANDAGES/DRESSINGS) ×1 IMPLANT
CHLORAPREP W/TINT 26ML (MISCELLANEOUS) ×2 IMPLANT
CLAMP CORD UMBIL (MISCELLANEOUS) IMPLANT
CLOTH BEACON ORANGE TIMEOUT ST (SAFETY) ×2 IMPLANT
DRSG OPSITE POSTOP 4X10 (GAUZE/BANDAGES/DRESSINGS) ×2 IMPLANT
ELECT REM PT RETURN 9FT ADLT (ELECTROSURGICAL) ×2
ELECTRODE REM PT RTRN 9FT ADLT (ELECTROSURGICAL) ×1 IMPLANT
EXTRACTOR VACUUM KIWI (MISCELLANEOUS) IMPLANT
GLOVE SURG ENC MOIS LTX SZ6 (GLOVE) ×2 IMPLANT
GLOVE SURG UNDER POLY LF SZ7 (GLOVE) ×4 IMPLANT
GOWN STRL REUS W/TWL LRG LVL3 (GOWN DISPOSABLE) ×4 IMPLANT
KIT ABG SYR 3ML LUER SLIP (SYRINGE) IMPLANT
NDL HYPO 25X5/8 SAFETYGLIDE (NEEDLE) IMPLANT
NEEDLE HYPO 22GX1.5 SAFETY (NEEDLE) IMPLANT
NEEDLE HYPO 25X5/8 SAFETYGLIDE (NEEDLE) IMPLANT
NS IRRIG 1000ML POUR BTL (IV SOLUTION) ×2 IMPLANT
PACK C SECTION WH (CUSTOM PROCEDURE TRAY) ×2 IMPLANT
PAD OB MATERNITY 4.3X12.25 (PERSONAL CARE ITEMS) ×2 IMPLANT
PENCIL SMOKE EVAC W/HOLSTER (ELECTROSURGICAL) ×1 IMPLANT
RETRACTOR WND ALEXIS 25 LRG (MISCELLANEOUS) IMPLANT
RTRCTR WOUND ALEXIS 25CM LRG (MISCELLANEOUS)
STRIP CLOSURE SKIN 1/2X4 (GAUZE/BANDAGES/DRESSINGS) ×1 IMPLANT
SUT PLAIN 0 NONE (SUTURE) ×2 IMPLANT
SUT VIC AB 0 CT1 36 (SUTURE) ×7 IMPLANT
SUT VIC AB 2-0 CT1 27 (SUTURE) ×2
SUT VIC AB 2-0 CT1 TAPERPNT 27 (SUTURE) ×1 IMPLANT
SUT VIC AB 4-0 KS 27 (SUTURE) ×2 IMPLANT
SYR CONTROL 10ML LL (SYRINGE) IMPLANT
TOWEL OR 17X24 6PK STRL BLUE (TOWEL DISPOSABLE) ×2 IMPLANT
TRAY FOLEY W/BAG SLVR 14FR LF (SET/KITS/TRAYS/PACK) IMPLANT
WATER STERILE IRR 1000ML POUR (IV SOLUTION) ×2 IMPLANT

## 2021-07-10 NOTE — Anesthesia Postprocedure Evaluation (Signed)
Anesthesia Post Note ? ?Patient: Mackenzie Schultz ? ?Procedure(s) Performed: CESAREAN SECTION ? ?  ? ?Patient location during evaluation: PACU ?Anesthesia Type: Epidural ?Level of consciousness: awake and alert ?Pain management: pain level controlled ?Vital Signs Assessment: post-procedure vital signs reviewed and stable ?Respiratory status: spontaneous breathing, nonlabored ventilation and respiratory function stable ?Cardiovascular status: stable ?Postop Assessment: no headache, no backache and epidural receding ?Anesthetic complications: no ? ? ?No notable events documented. ? ?Last Vitals:  ?Vitals:  ? 07/10/21 0510 07/10/21 0610  ?BP: 124/73 135/88  ?Pulse: 77 70  ?Resp: 18 17  ?Temp: 36.4 ?C 36.6 ?C  ?SpO2: 96% 97%  ?  ?Last Pain:  ?Vitals:  ? 07/10/21 0610  ?TempSrc: Oral  ?PainSc:   ? ?Pain Goal: Patients Stated Pain Goal: 3 (07/07/21 2235) ? ?  ?  ?  ?  ?  ?  ?  ? ?Mellody Dance ? ? ? ? ?

## 2021-07-10 NOTE — Lactation Note (Signed)
This note was copied from a baby's chart. ?Lactation Consultation Note ?Mom was sleeping. RN had call ed for LC. LC w/another pt. And then L&D. Mom was sleeping when LC arrived. Spoke w/RN. RN stated mom wanted to give formula for this feeding. ? ?Patient Name: Mackenzie Schultz ?Today's Date: 07/10/2021 ?  ?Age:28 hours ? ?Maternal Data ?  ? ?Feeding ?Nipple Type: Slow - flow ? ?LATCH Score ?  ? ?  ? ?  ? ?  ? ?  ? ?  ? ? ?Lactation Tools Discussed/Used ?  ? ?Interventions ?  ? ?Discharge ?  ? ?Consult Status ?  ? ? ? ?Charyl Dancer ?07/10/2021, 6:26 AM ? ? ? ?

## 2021-07-10 NOTE — Lactation Note (Signed)
This note was copied from a baby's chart. ?Lactation Consultation Note ? ?Patient Name: Mackenzie Schultz ?Today's Date: 07/10/2021 ?Reason for consult: Initial assessment;Term ?Age:28 hours ? ?Baby at 10 hours old. Mom holding baby when [facility] student entered.  ? ?Mom plans to breastfeed and formula feed. Mom stated she "is really tired" and will try breastfeeding later. LC student explained paced bottle feeding, milk production, and encouraged Mom to stimulate breasts. [facility] student discussed normal newborn behavior.  ? ?LC student provided a manual pump. Mom declined pump education. Mom had visitors in room during visit. Pump education needed.  ? ?Mom stated she would call lactation if assistance needed.  ? ?Feeding plan ?1- Feed on demand 8+/24 hr period.  ?2 - Pump with manual pump to stimulate breasts. ?3 - Continue skin to skin ?4 - Contact RN/LC if assistance needed.  ?5 - All questions and concerns answered.  ? ?Feeding ?Mother's Current Feeding Choice: Formula ?Nipple Type: Slow - flow ? ?Discharge ?Pump: Manual ? ?Consult Status ?Consult Status: PRN ? ? ? ?Shana Chute ?07/10/2021, 12:22 PM ? ? ? ?

## 2021-07-10 NOTE — Transfer of Care (Signed)
Immediate Anesthesia Transfer of Care Note ? ?Patient: Mackenzie Schultz ? ?Procedure(s) Performed: CESAREAN SECTION ? ?Patient Location: PACU ? ?Anesthesia Type:Epidural ? ?Level of Consciousness: awake, alert  and oriented ? ?Airway & Oxygen Therapy: Patient Spontanous Breathing ? ?Post-op Assessment: Report given to RN and Post -op Vital signs reviewed and stable ? ?Post vital signs: Reviewed and stable ? ?Last Vitals:  ?Vitals Value Taken Time  ?BP    ?Temp    ?Pulse    ?Resp    ?SpO2    ? ? ?Last Pain:  ?Vitals:  ? 07/10/21 0020  ?TempSrc: Axillary  ?PainSc: 0-No pain  ?   ? ?Patients Stated Pain Goal: 3 (07/07/21 2235) ? ?Complications: No notable events documented. ?

## 2021-07-10 NOTE — Progress Notes (Signed)
Labor Progress Note ?Mackenzie Schultz is a 26 y.o. G1P0000 at [redacted]w[redacted]d who presented for IOL due to [place].  ? ?S: Resting in bed. Partner at bedside. No concerns.  ? ?O:  ?BP 109/60   Pulse 79   Temp 100 ?F (37.8 ?C) (Axillary)   Resp 16   Ht 5\' 7"  (1.702 m)   Wt 86.6 kg   LMP 09/30/2020 Comment: negative HCG blood test 10-28-2020  SpO2 100%   Breastfeeding Unknown   BMI 29.91 kg/m?  ? ?EFM: Baseline 180 bpm, minimal variability, no accels, late decels  ? ?CVE: Dilation: 3.5 ?Effacement (%): 70 ?Cervical Position: Middle ?Station: -2 ?Presentation: Vertex ?Exam by:: Dr. [email or web address] ? ?A&P: 26 y.o. G1P0000 [redacted]w[redacted]d  ? ?#Labor: SVE unchanged despite being on Pitocin for over 24 hours and AROM 22 hours ago. Pitocin discontinued due to recurrent late decelerations. Discussed with Dr. [redacted]w[redacted]d, cesarean delivery recommended due to NRFHT/fetal intolerance of labor.  ? ?The risks of surgery were discussed with the patient including but were not limited to: bleeding which may require transfusion or reoperation; infection which may require antibiotics; injury to bowel, bladder, ureters or other surrounding organs; injury to the fetus; need for additional procedures including hysterectomy in the event of a life-threatening hemorrhage; formation of adhesions; placental abnormalities with subsequent pregnancies; incisional problems; thromboembolic phenomenon and other postoperative/anesthesia complications.  The patient concurred with the proposed plan, giving informed written consent for the procedure.  Anesthesia and OR aware. Preoperative prophylactic antibiotics and SCDs ordered on call to the OR.  To OR when ready. ? ?#Pain: Epidural in place  ?#FWB: Cat 2 due to late decels with minimal variability. Decels resolving after discontinuation of Pitocin. Will continue to monitor closely.  ?#GBS negative ? ?#cHTN: BP remains stable. Will continue to monitor postpartum.  ? ?Para March, MD ?12:29 AM ? ?

## 2021-07-10 NOTE — Discharge Summary (Signed)
? ?  Postpartum Discharge Summary ? ?   ?Patient Name: Mackenzie Schultz ?DOB: Aug 30, 1995 ?MRN: [id number] ? ?Date of admission: 07/07/2021 ?Delivery date:07/10/2021  ?Delivering provider: Radene Gunning  ?Date of discharge: 07/13/2021 ? ?Admitting diagnosis: Chronic hypertension affecting pregnancy [O10.919] ?Intrauterine pregnancy: [redacted]w[redacted]d    ?Secondary diagnosis:  Principal Problem: ?  Cesarean delivery delivered ?Active Problems: ?  Supervision of high risk pregnancy, antepartum ?  Maternal obesity affecting pregnancy, antepartum ?  Heart murmur ?  Chronic hypertension in obstetric context in second trimester ?  Acute blood loss as cause of postoperative anemia ? ?Additional problems: None    ?Discharge diagnosis: Term Pregnancy Delivered                                              ?Post partum procedures: None ?Augmentation: AROM, Pitocin, Cytotec, and IP Foley ?Complications: None ? ?Hospital course: Induction of Labor With Vaginal Delivery   ?26y.o. yo G1P0000 at 462w3das admitted to the hospital 07/07/2021 for induction of labor.  Indication for induction:  cHTN .  Patient started her induction with multiple doses of Cytotec followed by foley balloon placement, Pitocin, and AROM. Patient had minimal change in her SVE after balloon dislodged, despite being titrated on Pitocin with subsequent AROM. Patient then started to have recurrent late decelerations with fetal tachycardia, requiring discontinuation of Pitocin. Patient went for cesarean section due to NRFHT and fetal intolerance of labor.  ?Membrane Rupture Time/Date: 2:20 AM ,07/09/2021   ?Delivery Method:C-Section, Low Transverse  ?Details of delivery can be found in separate delivery note.  Patient had a routine postpartum course.  Her hemoglobin on POD#1 was 9.2, for which she received PO iron. For her cHTN she was started on a 5 day course of PO lasix and due to her BP having more than three reads >130 she was started on Procardia 3047maily.  She is  eating, drinking, ambulating, voiding, and passing flatus.  Patient is discharged home 07/13/21. ? ?Newborn Data: ?Birth date:07/10/2021  ?Birth time:1:40 AM  ?Gender:Female  ?Living status:Living  ?Apgars:8 ,9  ?Weight:3240 g  ? ?Magnesium Sulfate received: No ?BMZ received: No ?Rhophylac: N/A ?MMR: N/A ?T-DaP: Given prenatally ?Flu: No ?Transfusion: No  ? ?Physical exam  ?Vitals:  ? 07/12/21 1306 07/12/21 2242 07/13/21 0527 07/13/21 0640  ?BP: 129/84 132/85 134/90 133/82  ?Pulse: 79 76 73 77  ?Resp: _0 ?Temp: 98.5 ?F (36.9 ?C) 98.8 ?F (37.1 ?C) 98.5 ?F (36.9 ?C)   ?TempSrc: Oral Oral Oral   ?SpO2: 99%  98%   ?Weight:      ?Height:      ? ?General: alert ?Lochia: appropriate ?Uterine Fundus: firm ?Incision: Dressing is clean, dry, and intact ?DVT Evaluation: No evidence of DVT seen on physical exam. ? ?Labs: ?Lab Results  ?Component Value Date  ? WBC 20.3 (H) 07/10/2021  ? HGB 9.2 (L) 07/10/2021  ? HCT 29.2 (L) 07/10/2021  ? MCV 84.9 07/10/2021  ? PLT 228 07/10/2021  ? ? ?  Latest Ref Rng & Units 07/07/2021  ? 11:47 AM  ?CMP  ?Glucose 70 - 99 mg/dL 76    ?BUN 6 - 20 mg/dL 16    ?Creatinine 0.44 - 1.00 mg/dL 0.85    ?Sodium 135 - 145 mmol/L 136    ?Potassium 3.5 - 5.1 mmol/L 4.3    ?  Chloride 98 - 111 mmol/L 109    ?CO2 22 - 32 mmol/L 16    ?Calcium 8.9 - 10.3 mg/dL 9.3    ?Total Protein 6.5 - 8.1 g/dL 6.5    ?Total Bilirubin 0.3 - 1.2 mg/dL 0.7    ?Alkaline Phos 38 - 126 U/L 121    ?AST 15 - 41 U/L 22    ?ALT 0 - 44 U/L 15    ? ?Edinburgh Score: ? ?  07/10/2021  ?  7:33 AM  ?Flavia Shipper Postnatal Depression Scale Screening Tool  ?I have been able to laugh and see the funny side of things. 0  ?I have looked forward with enjoyment to things. 0  ?I have blamed myself unnecessarily when things went wrong. 0  ?I have been anxious or worried for no good reason. 0  ?I have felt scared or panicky for no good reason. 0  ?Things have been getting on top of me. 0  ?I have been so unhappy that I have had difficulty sleeping. 0   ?I have felt sad or miserable. 0  ?I have been so unhappy that I have been crying. 0  ?The thought of harming myself has occurred to me. 0  ?Edinburgh Postnatal Depression Scale Total 0  ? ? ? ?After visit meds:  ?Allergies as of 07/13/2021   ? ?   Reactions  ? Coconut (cocos Nucifera) Allergy Skin Test Swelling  ? Ibuprofen Other (See Comments)  ? "it just won't stay down makes me feel sick and makes my stomach burn"  ? Mushroom Extract Complex Other (See Comments)  ? "her body mood just changes"  ? ?  ? ?  ?Medication List  ?  ? ?STOP taking these medications   ? ?aspirin EC 81 MG tablet ?  ?cyclobenzaprine 5 MG tablet ?Commonly known as: FLEXERIL ?  ?promethazine 12.5 MG tablet ?Commonly known as: PHENERGAN ?  ? ?  ? ?TAKE these medications   ? ?acetaminophen 500 MG tablet ?Commonly known as: TYLENOL ?Take 2 tablets (1,000 mg total) by mouth every 6 (six) hours. ?  ?Blood Pressure Kit Devi ?1 kit by Does not apply route once a week. ?  ?furosemide 20 MG tablet ?Commonly known as: LASIX ?Take 1 tablet (20 mg total) by mouth daily for 2 days. ?  ?Ipratropium-Albuterol 20-100 MCG/ACT Aers respimat ?Commonly known as: COMBIVENT ?Inhale 1 puff into the lungs every 6 (six) hours as needed for wheezing or shortness of breath. ?  ?NIFEdipine 30 MG 24 hr tablet ?Commonly known as: ADALAT CC ?Take 1 tablet (30 mg total) by mouth daily. ?  ?oxyCODONE 5 MG immediate release tablet ?Commonly known as: Oxy IR/ROXICODONE ?Take 1 tablet (5 mg total) by mouth every 6 (six) hours as needed for severe pain. ?  ?polyethylene glycol 17 g packet ?Commonly known as: MIRALAX / GLYCOLAX ?Take 17 g by mouth daily as needed for mild constipation. ?  ?Vitafol Gummies 3.33-0.333-34.8 MG Chew ?Chew 3 each by mouth daily. ?  ? ?  ? ? ? ?Discharge home in stable condition ?Infant Feeding: Bottle and Breast ?Infant Disposition: home with mother ?Discharge instruction: per After Visit Summary and Postpartum booklet. ?Activity: Advance as  tolerated. Pelvic rest for 6 weeks.  ?Diet: routine diet ?Future Appointments: ?Future Appointments  ?Date Time Provider Sandy  ?07/19/2021  1:40 PM [facility] NURSE [facility] None  ?07/29/2021  1:50 PM Shelly Bombard, MD [facility] None  ? ?Follow up Visit: ?Message sent to [facility] by  Dr. Gwenlyn Perking on 07/10/21.  ? ?Please schedule this patient for a In person postpartum visit in 6 weeks with the following provider: Any provider. ?Additional Postpartum F/U: Incision check 1 week and BP check 1 week  ?High risk pregnancy complicated by: HTN ?Delivery mode:  C-Section, Low Transverse  ?Anticipated Birth Control:  declines ? ?Renard Matter, MD, MPH ?[facility] Fellow, Faculty Practice ? ? ?

## 2021-07-10 NOTE — Op Note (Signed)
Mackenzie Schultz ? ?PROCEDURE DATE: 07/10/2021 ? ?PREOPERATIVE DIAGNOSES: Intrauterine pregnancy at [redacted]w[redacted]d weeks gestation; non-reassuring fetal status ? ?POSTOPERATIVE DIAGNOSES: The same ? ?PROCEDURE: Primary Low Transverse Cesarean Section ? ?SURGEON:  Dr. Radene Gunning ? ?ASSISTANT:  Dr. Vilma Meckel  ? ?ANESTHESIOLOGY TEAM: Anesthesiologist: Merlinda Frederick, MD; Josephine Igo, MD ? ?INDICATIONS: Mackenzie Schultz is a 26 y.o. G1P0000 at [redacted]w[redacted]d here for cesarean section secondary to the indications listed under preoperative diagnoses; please see preoperative note for further details.  The risks of cesarean section were discussed with the patient including but were not limited to: bleeding which may require transfusion or reoperation; infection which may require antibiotics; injury to bowel, bladder, ureters or other surrounding organs; injury to the fetus; need for additional procedures including hysterectomy in the event of a life-threatening hemorrhage; placental abnormalities wth subsequent pregnancies, incisional problems, thromboembolic phenomenon and other postoperative/anesthesia complications.   The patient concurred with the proposed plan, giving informed written consent for the procedure.   ? ?FINDINGS:  Viable female infant in cephalic presentation.  Apgars 8 and 9.  Clear amniotic fluid.  Intact placenta, three vessel cord.  Normal uterus, fallopian tubes and ovaries bilaterally.  Right uterine extension. ? ?ANESTHESIA: Epidural  ?INTRAVENOUS FLUIDS: 1000 ml   ?ESTIMATED BLOOD LOSS: 806 ml ?URINE OUTPUT:  100 ml ?SPECIMENS: Placenta sent to pathology  ?COMPLICATIONS: None immediate ? ?PROCEDURE IN DETAIL:   ?The patient preoperatively received intravenous antibiotics and had sequential compression devices applied to her lower extremities.  She was then taken to the operating room where the epidural anesthesia was dosed up to surgical level and was found to be adequate. She was then placed in a  dorsal supine position with a leftward tilt, and prepped and draped in a sterile manner.  A foley catheter was in place.   ? ?After an adequate timeout was performed, a Pfannenstiel skin incision was made with a scalpel and carried through to the underlying layer of fascia. The fascia was incised in the midline, and this incision was extended bilaterally bluntly.  The rectus muscles were separated in the midline and the peritoneum was entered bluntly. A bladder blade was placed.   ? ?Attention was turned to the lower uterine segment where a low transverse hysterotomy was made with a scalpel and extended bilaterally bluntly.  The infant was successfully delivered, the cord was clamped and cut after one minute, and the infant was handed over to the awaiting neonatology team. Uterine massage was then administered, and the placenta delivered intact with a three-vessel cord. The uterus was then cleared of clots and debris.   ? ?The hysterotomy was closed with 0 Vicryl in a running locked fashion.  A right uterine extension was noted and repaired with 0 Vicryl.  An imbricating layer was then placed with 0 Vicryl.  Figure-of-eight 0 Vicryl serosal stitches were placed to help with hemostasis.  The pelvis was cleared of all clot and debris.  Hemostasis was confirmed on all surfaces.  The retractor was removed.   ? ?The peritoneum was closed with a 2-0 Vicryl running stitch. The fascia was then closed using 0 Vicryl in a running fashion.  The subcutaneous layer was irrigated and the skin was closed with a 4-0 Vicryl subcuticular stitch. The patient tolerated the procedure well. Sponge, instrument and needle counts were correct x 3.  She was taken to the recovery room in stable condition.  ? ?Vilma Meckel, MD  ?[facility] Fellow  Faculty Practice  ? ? ?

## 2021-07-11 DIAGNOSIS — D62 Acute posthemorrhagic anemia: Secondary | ICD-10-CM | POA: Diagnosis not present

## 2021-07-11 MED ORDER — EPINEPHRINE TOPICAL FOR CIRCUMCISION 0.1 MG/ML: 1.0000 [drp] | TOPICAL | Status: DC | PRN

## 2021-07-11 MED ORDER — ACETAMINOPHEN FOR CIRCUMCISION 160 MG/5 ML: 40.0000 mg | ORAL | Status: DC | PRN

## 2021-07-11 MED ORDER — LIDOCAINE 1% INJECTION FOR CIRCUMCISION: 0.8000 mL | INJECTION | Freq: Once | INTRAVENOUS | Status: DC

## 2021-07-11 MED ORDER — WHITE PETROLATUM EX OINT: 1.0000 "application " | TOPICAL_OINTMENT | CUTANEOUS | Status: DC | PRN

## 2021-07-11 MED ORDER — SUCROSE 24% NICU/PEDS ORAL SOLUTION: 0.5000 mL | OROMUCOSAL | Status: DC | PRN

## 2021-07-11 MED ORDER — FERROUS GLUCONATE 324 (38 FE) MG PO TABS
324.0000 mg | ORAL_TABLET | ORAL | Status: DC
  Administered 2021-07-11: 324 mg via ORAL
  Filled 2021-07-11 (×2): qty 1

## 2021-07-11 MED ORDER — ACETAMINOPHEN FOR CIRCUMCISION 160 MG/5 ML: 40.0000 mg | Freq: Once | ORAL | Status: DC

## 2021-07-11 MED ORDER — FUROSEMIDE 20 MG PO TABS
20.0000 mg | ORAL_TABLET | Freq: Every day | ORAL | Status: DC
  Administered 2021-07-11 – 2021-07-13 (×3): 20 mg via ORAL
  Filled 2021-07-11 (×3): qty 1

## 2021-07-11 NOTE — Social Work (Signed)
CSW received consult for hx of panic attacks.  CSW met with MOB to offer support and complete assessment.    ? ?CSW introduced self and role. CSW observed MOB significant other Drue Dun also present bedside. Infant was observed sleeping in bassinet. CSW offered to speak with MOB in private, however MOB declined. CSW informed MOB of the reason for consult and assessed current mood. MOB reported she is feeling "great." MOB stated she also had a good pregnancy. MOB acknowledged a history of panic attacks, dating back to 2018. MOB denies experiencing any symptoms during pregnancy or recently. MOB stated she has never been on medication or to therapy to treat. MOB identified significant other as a primary support. Significant other Carmon Ginsberg was also involved in the conversation and expressed being a positive support. MOB denies any current SI or HI.  ? ?CSW provided education regarding the baby blues period versus perinatal mood disorders and provided resources for mental health follow up if concerns arise.  CSW recommended self-evaluation using the New Mom Checklist.   ?CSW provided review of Sudden Infant Death Syndrome (SIDS) precautions.  MOB stated infant will sleep in a bassinet. MOB reported she has everything needed for infant. MOB is enrolled in [facility] benefits and aware to make contact of infant's birth. MOB is still in the process of identifying a pediatrician. Both parents deny any transportation barriers to care.  ? ?CSW identifies no further need for intervention and no barriers to discharge at this time. ? ?Darra Lis, LCSW ?Clinical Social Work ?[facility] [place] ?[phone]  ?

## 2021-07-11 NOTE — Progress Notes (Signed)
Postpartum Day 1: Cesarean Delivery ? ?Subjective: ?Patient reports vomiting, tolerating PO, + flatus, and no problems voiding.  Baby is stable at bedside. ? ?Objective: ?Vital signs in last 24 hours: ?Temp:  [97.7 ?F (36.5 ?C)-98.9 ?F (37.2 ?C)] 98 ?F (36.7 ?C) (03/26 0539) ?Pulse Rate:  [68-73] 73 (03/26 0539) ?Resp:  [16-18] 16 (03/26 0539) ?BP: (104-115)/(60-68) 104/66 (03/26 0539) ?SpO2:  [99 %-100 %] 99 % (03/26 0539) ? ?Physical Exam:  ?General: alert and no distress ?Lochia: appropriate ?Uterine Fundus: firm ?Incision: healing well, no significant drainage ?DVT Evaluation: No evidence of DVT seen on physical exam. ?Negative Homan's sign. ?No cords or calf tenderness. ? ?Recent Labs  ?  07/09/21 ?1233 07/10/21 ?0350  ?HGB 10.3* 9.2*  ?HCT 31.5* 29.2*  ? ? ?Assessment/Plan: ?Status post Cesarean section. Doing well postoperatively.  ?Continue Procardia and Lasix for CHTN, stable BP for now ?Started on oral iron therapy for acute blood loss anemia, asymptomatic ?Analgesia as needed ?Encourage OOB ?Patient desires circumcision for her female infant.  Circumcision procedure details discussed, risks and benefits of procedure were also discussed.  These include but are not limited to: Benefits of circumcision in men include reduction in the rates of urinary tract infection (UTI), penile cancer, some sexually transmitted infections, penile inflammatory and retractile disorders, as well as easier hygiene.  Risks include bleeding , infection, injury of glans which may lead to penile deformity or urinary tract issues, unsatisfactory cosmetic appearance and other potential complications related to the procedure.  It was emphasized that this is an elective procedure.  Patient wants to proceed with circumcision; written informed consent obtained.  Will do infant circumcision prior to discharge, routine circumcision and post circumcision care ordered for the infant. ?Continue current care. ? ?Jaynie Collins, MD ?07/11/2021,  9:12 AM ? ? ?

## 2021-07-12 NOTE — Progress Notes (Signed)
POSTPARTUM PROGRESS NOTE ? ?POD #2 ? ?Subjective: ? ?Mackenzie Schultz is a 26 y.o. G1P1001 s/p pLTCS at [redacted]w[redacted]d.  She reports she doing well. No acute events overnight. She reports she is doing well. She denies any problems with ambulating, voiding or po intake. Denies nausea or vomiting. She has passed flatus. Pain is well controlled.  Lochia is appropriate. ? ?Objective: ?Blood pressure 118/80, pulse 69, temperature 97.9 ?F (36.6 ?C), temperature source Oral, resp. rate 16, height 5\' 7"  (1.702 m), weight 86.6 kg, last menstrual period 09/30/2020, SpO2 100 %, unknown if currently breastfeeding. ? ?Physical Exam:  ?General: alert, cooperative and no distress ?Chest: no respiratory distress ?Heart:regular rate, distal pulses intact ?Abdomen: soft, nontender,  ?Uterine Fundus: firm, appropriately tender ?DVT Evaluation: No calf swelling or tenderness ?Extremities: no LE edema ?Skin: warm, dry; incision clean/dry/intact w/ honeycomb dressing in place ? ?Recent Labs  ?  07/09/21 ?1233 07/10/21 ?0350  ?HGB 10.3* 9.2*  ?HCT 31.5* 29.2*  ? ? ?Assessment/Plan: ?Mackenzie Schultz is a 26 y.o. G1P1001 s/p pLTCS at [redacted]w[redacted]d for arrest of descent. ? ?POD#2 - Doing welll; pain  controlled. H/H appropriate ? Routine postpartum care ? OOB, ambulated ? Lovenox for VTE prophylaxis ?Anemia: asymptomatic ? ferrous gluconate every other day ?Contraception: declines ?Feeding: breast and bottle ? ?Dispo: Plan for discharge tomorrow. ? ? LOS: 5 days  ? ?[redacted]w[redacted]d MD ?FM PGY-1 ?07/12/2021, 7:41 AM  ?

## 2021-07-13 ENCOUNTER — Other Ambulatory Visit (HOSPITAL_COMMUNITY): Payer: Self-pay

## 2021-07-13 LAB — SURGICAL PATHOLOGY

## 2021-07-13 MED ORDER — POLYETHYLENE GLYCOL 3350 17 GM/SCOOP PO POWD
17.0000 g | Freq: Every day | ORAL | 0 refills | Status: AC | PRN
  Filled 2021-07-13: qty 238, 14d supply, fill #0

## 2021-07-13 MED ORDER — OXYCODONE HCL 5 MG PO TABS
5.0000 mg | ORAL_TABLET | Freq: Four times a day (QID) | ORAL | 0 refills | Status: DC | PRN
Start: 2021-07-13 — End: 2023-02-27
  Filled 2021-07-13: qty 5, 2d supply, fill #0

## 2021-07-13 MED ORDER — POLYETHYLENE GLYCOL 3350 17 G PO PACK: 17.0000 g | PACK | Freq: Every day | ORAL | Status: DC | PRN

## 2021-07-13 MED ORDER — FERROUS GLUCONATE 324 (38 FE) MG PO TABS
324.0000 mg | ORAL_TABLET | ORAL | 0 refills | Status: DC
  Filled 2021-07-13: qty 15, 30d supply, fill #0

## 2021-07-13 MED ORDER — FUROSEMIDE 20 MG PO TABS
20.0000 mg | ORAL_TABLET | Freq: Every day | ORAL | 0 refills | Status: DC
Start: 2021-07-13 — End: 2023-01-12
  Filled 2021-07-13: qty 2, 2d supply, fill #0

## 2021-07-13 MED ORDER — NIFEDIPINE ER 30 MG PO TB24
30.0000 mg | ORAL_TABLET | Freq: Every day | ORAL | 0 refills | Status: DC
  Filled 2021-07-13: qty 60, 60d supply, fill #0

## 2021-07-13 MED ORDER — ACETAMINOPHEN 500 MG PO TABS
1000.0000 mg | ORAL_TABLET | Freq: Four times a day (QID) | ORAL | 0 refills | Status: AC
  Filled 2021-07-13: qty 30, 4d supply, fill #0

## 2021-07-13 NOTE — Lactation Note (Signed)
This note was copied from a baby's chart. ?Lactation Consultation Note ? ?Patient Name: Mackenzie Schultz ?Today's Date: 07/13/2021 ?Reason for consult:  (mom has just been formula feeding) ?Age:28 days ? ?Maternal Data ?  ? ?Feeding ?Nipple Type: Slow - flow ? ?LATCH Score ?  ? ?  ? ?  ? ?  ? ?  ? ?  ? ? ?Lactation Tools Discussed/Used ?  ? ?Interventions ?  ? ?Discharge ?  ? ?Consult Status ?Consult Status: Complete ?Date: 07/13/21 ? ? ? ?Matilde Sprang Aianna Fahs ?07/13/2021, 1:11 PM ? ? ? ?

## 2021-07-21 ENCOUNTER — Telehealth (HOSPITAL_COMMUNITY): Payer: Self-pay | Admitting: *Deleted

## 2021-07-21 NOTE — Telephone Encounter (Signed)
Hospital Discharge Follow-Up Call: ? ?Patient reports that she is well and has no concerns about her healing process.  She says that she is taking her blood pressure at home and that they have been "not too high".  She says she is enrolled in the [employer] program and is entering her BPs into the app on her phone.  EPDS today was 0 and she endorses this accurately reflects that she is doing well emotionally. ? ?Patient reports that baby is well and has an appointment with the pediatrician on 07/22/21.  She said that she had to arrange transportation to the appointment and that she will be able to get there tomorrow.  She reports that baby sleeps in a crib.  Reviewed ABCs of Safe Sleep. ?

## 2021-07-29 ENCOUNTER — Encounter: Payer: Self-pay | Admitting: Obstetrics

## 2021-07-29 ENCOUNTER — Telehealth (INDEPENDENT_AMBULATORY_CARE_PROVIDER_SITE_OTHER): Payer: Medicaid Other | Admitting: Obstetrics

## 2021-07-29 DIAGNOSIS — O165 Unspecified maternal hypertension, complicating the puerperium: Secondary | ICD-10-CM | POA: Diagnosis not present

## 2021-07-29 DIAGNOSIS — I10 Essential (primary) hypertension: Secondary | ICD-10-CM

## 2021-07-29 DIAGNOSIS — Z3009 Encounter for other general counseling and advice on contraception: Secondary | ICD-10-CM | POA: Diagnosis not present

## 2021-07-29 NOTE — Progress Notes (Signed)
? ?Provider location: [facility] [employer] [facility]  ? ?Patient location: Home ? ?I connected withNAME@ on 07/29/21 at  1:50 PM EDT by Mychart Video Encounter and verified that I am speaking with the correct person using two identifiers.     ?  ?I discussed the limitations, risks, security and privacy concerns of performing an evaluation and management service virtually and the availability of in person appointments. I also discussed with the patient that there may be a patient responsible charge related to this service. The patient expressed understanding and agreed to proceed. ? ?Post Partum Visit Note ?Subjective:  ? ?  ?Mackenzie Schultz is a 26 y.o. G95P1001 female who presents for a postpartum visit. She is 3 week postpartum following a primary cesarean section.  I have fully reviewed the prenatal and intrapartum course. The delivery was at [redacted]w[redacted]d gestational weeks.  Anesthesia: epidural. Postpartum course has been. Baby is doing well. Baby is feeding by bottle - Enfamil . Bleeding  light bleeding . Bowel function is normal. Bladder function is normal. Patient is not sexually active. Contraception method is none. Postpartum depression screening: negative. ? ? ?The pregnancy intention screening data noted above was reviewed. Potential methods of contraception were discussed. The patient elected to proceed with No data recorded. ? ? Edinburgh Postnatal Depression Scale - 07/29/21 1353   ? ?  ? Edinburgh Postnatal Depression Scale:  In the Past 7 Days  ? I have been able to laugh and see the funny side of things. 0   ? I have looked forward with enjoyment to things. 0   ? I have blamed myself unnecessarily when things went wrong. 0   ? I have been anxious or worried for no good reason. 0   ? I have felt scared or panicky for no good reason. 0   ? Things have been getting on top of me. 0   ? I have been so unhappy that I have had difficulty sleeping. 0   ? I have felt sad or miserable. 0   ? I have been  so unhappy that I have been crying. 0   ? The thought of harming myself has occurred to me. 0   ? Edinburgh Postnatal Depression Scale Total 0   ? ?  ?  ? ?  ? ? ?The following portions of the patient's history were reviewed and updated as appropriate: allergies, current medications, past family history, past medical history, past social history, past surgical history, and problem list. ? ?Review of Systems ?A comprehensive review of systems was negative. ? ?Objective:  ?LMP 09/30/2020 Comment: negative HCG blood test 10-28-2020    ?General:  Alert, oriented and cooperative. Patient is in no acute distress.  ?Respiratory: Normal respiratory effort, no problems with respiration noted  ?Mental Status: Normal mood and affect. Normal behavior. Normal judgment and thought content.  ?Rest of physical exam deferred due to type of encounter   ?Assessment:  ? ? 1. Postpartum care following cesarean delivery ? ?2. Chronic hypertension ?- continue Procardia ? ?3. Class 3 severe obesity without serious comorbidity in adult, unspecified BMI, unspecified obesity type (HCC) ? ?4. Encounter for other general counseling or advice on contraception ?- declines contraception   ? ? ?Plan:  ?Essential components of care per ACOG recommendations: ? ?1.  Mood and well being: Patient with negative depression screening today. Reviewed local resources for support.  ?- Patient does not use tobacco.  ?- hx of drug use? No   ? ?  2. Infant care and feeding:  ?-Patient currently breastmilk feeding? No  ?-Social determinants of health (SDOH) reviewed in EPIC. No concerns ? ?3. Sexuality, contraception and birth spacing ?- Patient does not want a pregnancy in the next year.  Desired family size is 1 children.  ?- Reviewed forms of contraception in tiered fashion. Patient desired no method today.   ?- Discussed birth spacing of 18 months ? ?4. Sleep and fatigue ?-Encouraged family/partner/community support of 4 hrs of uninterrupted sleep to help with  mood and fatigue ? ?5. Physical Recovery  ?- Discussed patients delivery.   ?- Patient had a cesarean section for failure to progress ?- Patient has urinary incontinence? No ?- Patient is not safe to resume physical and sexual activity ? ?6.  Health Maintenance ?- Last pap smear done [phone] and was normal with negative HPV. ? ?7. Chronic Disease:  HTN ?- PCP follow up after postpartum visits in office is completed  ? ?I have spent a total of 10 minutes of non-face-to-face time, excluding clinical staff time, reviewing notes and preparing to see patient, ordering tests and/or medications, and counseling the patient.  ? ? ?Return in about 1 week (around 08/05/2021) for postpartum visit. ? ? ?Coral Ceo, MD ?[facility], [facility], [facility] ?07/29/21  ? ? ?

## 2021-08-06 ENCOUNTER — Encounter: Payer: Self-pay | Admitting: Obstetrics

## 2021-08-06 ENCOUNTER — Ambulatory Visit (INDEPENDENT_AMBULATORY_CARE_PROVIDER_SITE_OTHER): Payer: Medicaid Other | Admitting: Obstetrics

## 2021-08-06 DIAGNOSIS — I10 Essential (primary) hypertension: Secondary | ICD-10-CM | POA: Diagnosis not present

## 2021-08-06 NOTE — Progress Notes (Signed)
? ? ?Post Partum Visit Note ? ?Mackenzie Schultz is a 26 y.o. G83P1001 female who presents for a postpartum visit. She is  [redacted]w[redacted]d  week postpartum following a primary cesarean section.  I have fully reviewed the prenatal and intrapartum course. The delivery was at [redacted]w[redacted]d gestational weeks.  Anesthesia: epidural. Postpartum course has been good. Baby is doing well. Baby is feeding by both breast and bottle - Enfamil with Iron. Bleeding staining only. Bowel function is normal. Bladder function is normal. Patient is not sexually active. Contraception method is none. Postpartum depression screening: negative. ? ? ?The pregnancy intention screening data noted above was reviewed. Potential methods of contraception were discussed. The patient elected to proceed with No data recorded. ? ? Edinburgh Postnatal Depression Scale - 08/06/21 1028   ? ?  ? Edinburgh Postnatal Depression Scale:  In the Past 7 Days  ? I have been able to laugh and see the funny side of things. 0   ? I have looked forward with enjoyment to things. 0   ? I have blamed myself unnecessarily when things went wrong. 0   ? I have been anxious or worried for no good reason. 0   ? I have felt scared or panicky for no good reason. 0   ? Things have been getting on top of me. 0   ? I have been so unhappy that I have had difficulty sleeping. 0   ? I have felt sad or miserable. 0   ? I have been so unhappy that I have been crying. 0   ? The thought of harming myself has occurred to me. 0   ? Edinburgh Postnatal Depression Scale Total 0   ? ?  ?  ? ?  ? ? ?Health Maintenance Due  ?Topic Date Due  ? COVID-19 Vaccine (1) Never done  ? ? ?The following portions of the patient's history were reviewed and updated as appropriate: allergies, current medications, past family history, past medical history, past social history, past surgical history, and problem list. ? ?Review of Systems ?A comprehensive review of systems was negative. ? ?Objective:  ?BP (!) 144/108    Pulse 64   Ht 5\' 7"  (1.702 m)   Wt 177 lb 9.6 oz (80.6 kg)   LMP 09/30/2020 Comment: negative HCG blood test 10-28-2020  BMI 27.82 kg/m?   ? ?General:  alert and no distress  ? Breasts:  normal  ?Lungs: clear to auscultation bilaterally  ?Heart:  regular rate and rhythm, S1, S2 normal, no murmur, click, rub or gallop  ?Abdomen: soft, non-tender; bowel sounds normal; no masses,  no organomegaly  ?Wound : clean, dry and intact  ?GU exam:  not indicated  ?     ?Assessment:  ? ? 1. Postpartum care following cesarean delivery ? ?2. Chronic hypertension ?- clinically stable ?- continue Nifedipine    ? ?Plan:  ? ?Essential components of care per ACOG recommendations: ? ?1.  Mood and well being: Patient with negative depression screening today. Reviewed local resources for support.  ?- Patient tobacco use? No.   ?- hx of drug use? No.   ? ?2. Infant care and feeding:  ?-Patient currently breastmilk feeding? Yes. Discussed returning to work and pumping. Reviewed importance of draining breast regularly to support lactation.  ?-Social determinants of health (SDOH) reviewed in EPIC. No concerns ? ?3. Sexuality, contraception and birth spacing ?- Patient does not want a pregnancy in the next year.  Desired family size is  1 children.  ?- Reviewed reproductive life planning. Reviewed contraceptive methods based on pt preferences and effectiveness.  Patient desired No Method - Other Reason today.   ?- Discussed birth spacing of 18 months ? ?4. Sleep and fatigue ?-Encouraged family/partner/community support of 4 hrs of uninterrupted sleep to help with mood and fatigue ? ?5. Physical Recovery  ?- Discussed patients delivery and complications. She describes her labor as mixed. ?- Patient had a C-section, no problems at delivery.  ?- Patient has urinary incontinence? No. ?- Patient is not safe to resume physical and sexual activity ? ?6.  Health Maintenance ?- HM due items addressed Yes ?- Last pap smear  ?Diagnosis  ?Date Value Ref  Range Status  ?01/11/2021   Final  ? - Negative for intraepithelial lesion or malignancy (NILM)  ? Pap smear not done at today's visit.  ?-Breast Cancer screening indicated? No.  ? ?7. Chronic Disease/Pregnancy Condition follow up: None ? ? ?Baltazar Najjar, MD ?[facility] [place], [place], [facility] ?08/06/2021 ?

## 2021-09-02 ENCOUNTER — Ambulatory Visit (INDEPENDENT_AMBULATORY_CARE_PROVIDER_SITE_OTHER): Payer: Medicaid Other | Admitting: Obstetrics

## 2021-09-02 DIAGNOSIS — I10 Essential (primary) hypertension: Secondary | ICD-10-CM

## 2021-09-02 NOTE — Progress Notes (Signed)
Subjective:     Mackenzie Schultz is a 26 y.o. female who presents for a postpartum visit. She is 6 weeks postpartum following a low cervical transverse Cesarean section. I have fully reviewed the prenatal and intrapartum course. The delivery was at 40 gestational weeks. Outcome: primary cesarean section, low transverse incision. Anesthesia: epidural. Postpartum course has been complicated by hypertension. Baby's course has been normal. Baby is feeding by bottle.  Bleeding no bleeding. Bowel function is normal. Bladder function is normal. Patient is not sexually active. Contraception method is none. Postpartum depression screening: negative.  Tobacco, alcohol and substance abuse history reviewed.  Adult immunizations reviewed including TDAP, rubella and varicella.  The following portions of the patient's history were reviewed and updated as appropriate: allergies, current medications, past family history, past medical history, past social history, past surgical history, and problem list.  Review of Systems A comprehensive review of systems was negative.   Objective:    BP (!) 146/100   Pulse 86   Ht 5\' 7"  (1.702 m)   Wt 179 lb 14.4 oz (81.6 kg)   LMP 09/30/2020 Comment: negative HCG blood test 10-28-2020  Breastfeeding No   BMI 28.18 kg/m   General:  alert and no distress   Breasts:  inspection negative, no nipple discharge or bleeding, no masses or nodularity palpable  Lungs: clear to auscultation bilaterally  Heart:  regular rate and rhythm, S1, S2 normal, no murmur, click, rub or gallop  Abdomen: soft, non-tender; bowel sounds normal; no masses,  no organomegaly.  Incision is clean, dry and intact  Pelvic exam deferred.    I have spent a total of 20 minutes of face-to-face time, excluding clinical staff time, reviewing notes and preparing to see patient, ordering tests and/or medications, and counseling the patient.    Assessment:    1. Postpartum care following cesarean  delivery  2. Chronic hypertension - BP unstable but patient not taking meds for 6 weeks postpartum - follow up BP check in 2 weeks    Plan:    1. Contraception: none 2. Continue BP meds 3. Follow up in: 1 week or as needed.   10-30-2020, MD 09/02/2021 4:39 PM

## 2021-09-02 NOTE — Progress Notes (Signed)
Patient presents for a 4 week postpartum blood pressure check. Patient states that she does not want any birth control. Has no other concerns today. Patient denies taking her medication today. Last time she took medication for bp was about 6 weeks ago.

## 2022-11-14 ENCOUNTER — Encounter: Payer: Self-pay | Admitting: Obstetrics

## 2022-11-22 ENCOUNTER — Other Ambulatory Visit (HOSPITAL_COMMUNITY)
Admission: RE | Admit: 2022-11-22 | Discharge: 2022-11-22 | Disposition: A | Discharge status: Home / Self Care | Payer: BLUE CROSS/BLUE SHIELD | Source: Ambulatory Visit | Attending: Obstetrics and Gynecology | Admitting: Obstetrics and Gynecology

## 2022-11-22 ENCOUNTER — Ambulatory Visit (INDEPENDENT_AMBULATORY_CARE_PROVIDER_SITE_OTHER): Payer: BLUE CROSS/BLUE SHIELD

## 2022-11-22 VITALS — BP 119/83 | HR 93 | Ht 67.0 in | Wt 145.0 lb

## 2022-11-22 DIAGNOSIS — Z113 Encounter for screening for infections with a predominantly sexual mode of transmission: Secondary | ICD-10-CM

## 2022-11-22 NOTE — Progress Notes (Signed)
SUBJECTIVE:  27 y.o. female desires self swab. Denies abnormal vaginal bleeding or significant pelvic pain or fever. No UTI symptoms. Denies history of known exposure to STD.  Patient's last menstrual period was 11/05/2022 (approximate).  OBJECTIVE:  She appears well, afebrile. Urine dipstick: not done.  ASSESSMENT:  Screening for STD's Vaginal discharge.   PLAN:  GC, chlamydia, trichomonas, BVAG, CVAG probe sent to lab. Treatment: To be determined once lab results are received ROV prn if symptoms persist or worsen. STI blood work collected by lab.  Pt follow up as needed.

## 2022-11-23 MED ORDER — FLUCONAZOLE 150 MG PO TABS: 150.0000 mg | ORAL_TABLET | Freq: Once | ORAL | 0 refills | Status: AC

## 2022-11-23 NOTE — Addendum Note (Signed)
Addended by: Harvie Bridge on: 11/23/2022 08:14 PM   Modules accepted: Orders

## 2022-11-26 ENCOUNTER — Other Ambulatory Visit: Payer: Self-pay

## 2022-11-27 ENCOUNTER — Other Ambulatory Visit: Payer: Self-pay

## 2022-11-29 ENCOUNTER — Other Ambulatory Visit: Payer: Self-pay | Admitting: Emergency Medicine

## 2022-11-29 MED ORDER — FLUCONAZOLE 150 MG PO TABS: 150.0000 mg | ORAL_TABLET | Freq: Once | ORAL | 0 refills | Status: AC

## 2022-11-29 MED ORDER — METRONIDAZOLE 500 MG PO TABS: 500.0000 mg | ORAL_TABLET | Freq: Two times a day (BID) | ORAL | 0 refills | Status: DC

## 2022-11-29 NOTE — Progress Notes (Signed)
Rx for yeast and BV

## 2022-12-28 ENCOUNTER — Emergency Department (HOSPITAL_COMMUNITY): Payer: BLUE CROSS/BLUE SHIELD

## 2022-12-28 ENCOUNTER — Other Ambulatory Visit: Payer: Self-pay

## 2022-12-28 ENCOUNTER — Encounter (HOSPITAL_COMMUNITY): Payer: Self-pay

## 2022-12-28 ENCOUNTER — Emergency Department (HOSPITAL_COMMUNITY)
Admission: EM | Admit: 2022-12-28 | Discharge: 2022-12-28 | Disposition: A | Discharge status: Home / Self Care | Payer: BLUE CROSS/BLUE SHIELD | Attending: Emergency Medicine | Admitting: Emergency Medicine

## 2022-12-28 DIAGNOSIS — J45909 Unspecified asthma, uncomplicated: Secondary | ICD-10-CM | POA: Insufficient documentation

## 2022-12-28 DIAGNOSIS — I10 Essential (primary) hypertension: Secondary | ICD-10-CM | POA: Insufficient documentation

## 2022-12-28 DIAGNOSIS — R519 Headache, unspecified: Secondary | ICD-10-CM | POA: Insufficient documentation

## 2022-12-28 LAB — COMPREHENSIVE METABOLIC PANEL
ALT: 24 U/L (ref 0–44)
AST: 30 U/L (ref 15–41)
Albumin: 4 g/dL (ref 3.5–5.0)
Alkaline Phosphatase: 70 U/L (ref 38–126)
Anion gap: 9 (ref 5–15)
BUN: 19 mg/dL (ref 6–20)
CO2: 25 mmol/L (ref 22–32)
Calcium: 9.1 mg/dL (ref 8.9–10.3)
Chloride: 101 mmol/L (ref 98–111)
Creatinine, Ser: 0.79 mg/dL (ref 0.44–1.00)
GFR, Estimated: >60 mL/min (ref >60)
Glucose, Bld: 100 mg/dL — ABNORMAL HIGH (ref 70–99)
Potassium: 3.6 mmol/L (ref 3.5–5.1)
Sodium: 135 mmol/L (ref 135–145)
Total Bilirubin: 0.3 mg/dL (ref 0.3–1.2)
Total Protein: 7.6 g/dL (ref 6.5–8.1)

## 2022-12-28 LAB — URINALYSIS, W/ REFLEX TO CULTURE (INFECTION SUSPECTED)
Bilirubin Urine: NEGATIVE
Glucose, UA: NEGATIVE mg/dL
Ketones, ur: NEGATIVE mg/dL
Leukocytes,Ua: NEGATIVE
Nitrite: NEGATIVE
Protein, ur: NEGATIVE mg/dL
Specific Gravity, Urine: 1.024 (ref 1.005–1.030)
pH: 6 (ref 5.0–8.0)

## 2022-12-28 LAB — CBC WITH DIFFERENTIAL/PLATELET
Abs Immature Granulocytes: 0.01 10*3/uL (ref 0.00–0.07)
Basophils Absolute: 0 10*3/uL (ref 0.0–0.1)
Basophils Relative: 0 %
Eosinophils Absolute: 0 10*3/uL (ref 0.0–0.5)
Eosinophils Relative: 0 %
HCT: 39.1 % (ref 36.0–46.0)
Hemoglobin: 12 g/dL (ref 12.0–15.0)
Immature Granulocytes: 0 %
Lymphocytes Relative: 30 %
Lymphs Abs: 1.9 10*3/uL (ref 0.7–4.0)
MCH: 26.2 pg (ref 26.0–34.0)
MCHC: 30.7 g/dL (ref 30.0–36.0)
MCV: 85.4 fL (ref 80.0–100.0)
Monocytes Absolute: 0.6 10*3/uL (ref 0.1–1.0)
Monocytes Relative: 9 %
Neutro Abs: 3.8 10*3/uL (ref 1.7–7.7)
Neutrophils Relative %: 61 %
Platelets: 268 10*3/uL (ref 150–400)
RBC: 4.58 MIL/uL (ref 3.87–5.11)
RDW: 14.2 % (ref 11.5–15.5)
WBC: 6.2 10*3/uL (ref 4.0–10.5)
nRBC: 0 % (ref 0.0–0.2)

## 2022-12-28 LAB — HCG, SERUM, QUALITATIVE: Preg, Serum: NEGATIVE

## 2022-12-28 MED ORDER — DIPHENHYDRAMINE HCL 50 MG/ML IJ SOLN
12.5000 mg | Freq: Once | INTRAMUSCULAR | Status: AC
  Administered 2022-12-28: 12.5 mg via INTRAVENOUS
  Filled 2022-12-28: qty 1

## 2022-12-28 MED ORDER — KETOROLAC TROMETHAMINE 15 MG/ML IJ SOLN
15.0000 mg | Freq: Once | INTRAMUSCULAR | Status: AC
  Administered 2022-12-28: 15 mg via INTRAVENOUS
  Filled 2022-12-28: qty 1

## 2022-12-28 MED ORDER — METOCLOPRAMIDE HCL 5 MG/ML IJ SOLN
10.0000 mg | Freq: Once | INTRAMUSCULAR | Status: AC
  Administered 2022-12-28: 10 mg via INTRAVENOUS
  Filled 2022-12-28: qty 2

## 2022-12-28 MED ORDER — SODIUM CHLORIDE 0.9 % IV BOLUS
1000.0000 mL | Freq: Once | INTRAVENOUS | Status: AC
  Administered 2022-12-28: 1000 mL via INTRAVENOUS

## 2022-12-28 MED ORDER — MAGNESIUM SULFATE 2 GM/50ML IV SOLN
2.0000 g | Freq: Once | INTRAVENOUS | Status: AC
  Administered 2022-12-28: 2 g via INTRAVENOUS
  Filled 2022-12-28: qty 50

## 2022-12-28 NOTE — ED Triage Notes (Signed)
Pt coming in for recurrent headaches. Pt has been taking otc medications without improvement. Pt states they have been going on for years, but have been increasing in pain. Pt does endorse nausea with headaches.

## 2022-12-28 NOTE — Discharge Instructions (Signed)
You have been seen today for your complaint of headache. Your lab work was reassuring. Your imaging was reassuring. Your discharge medications include Aleve and Tylenol.  You may take Aleve twice daily.  You may take up to 1000 mg of Tylenol every 8 hours for pain. Home care instructions are as follows:  Avoid smoking and caffeine.  Drink plenty of water Follow up with: Your primary care provider.  I have also placed a referral to neurology.  They should call you within the next business week Please seek immediate medical care if you develop any of the following symptoms: Your headache: Gets very bad quickly. Gets worse after a lot of physical activity. You have any of these symptoms: You continue to vomit. A stiff neck. Trouble seeing. Your eye or ear hurts. Trouble speaking. Weak muscles or you lose muscle control. You lose your balance or have trouble walking. You feel like you will pass out (faint) or you pass out. You are mixed up (confused). You have a seizure. At this time there does not appear to be the presence of an emergent medical condition, however there is always the potential for conditions to change. Please read and follow the below instructions.  Do not take your medicine if  develop an itchy rash, swelling in your mouth or lips, or difficulty breathing; call 911 and seek immediate emergency medical attention if this occurs.  You may review your lab tests and imaging results in their entirety on your MyChart account.  Please discuss all results of fully with your primary care provider and other specialist at your follow-up visit.  Note: Portions of this text may have been transcribed using voice recognition software. Every effort was made to ensure accuracy; however, inadvertent computerized transcription errors may still be present.

## 2022-12-28 NOTE — ED Provider Notes (Signed)
[place] EMERGENCY DEPARTMENT AT [facility] Provider Note   CSN: [id number] Arrival date & time: 12/28/22  1414     History  Chief Complaint  Patient presents with   Headache    Mackenzie Schultz is a 27 y.o. female.  With a history of panic attacks, hypertension, asthma presenting for evaluation of headache.  She states she has had headaches her entire life.  She states nothing has ever helped her headaches.  She wakes up with a headache and was in bed with a headache.  No recent change to this headache.  She states she intermittently has nausea, blurred vision and dizziness with her headaches.  No recent change to the symptoms.  She does not feel any symptoms currently.  She has tried Tylenol and an injectable medication for symptoms with no improvement.  She has never followed up with anyone regarding her chronic headaches.  Current headache was not sudden in onset.  Currently rates it at a 9 out of 10.  Localized across the entire head.  Currently denies numbness, weakness or tingling.  Reports some nausea but no vomiting.   Headache      Home Medications Prior to Admission medications   Medication Sig Start Date End Date Taking? Authorizing Provider  acetaminophen (TYLENOL) 500 MG tablet Take 2 tablets (1,000 mg total) by mouth every 6 (six) hours. 07/13/21   Warner Mccreedy, MD  Blood Pressure Monitoring (BLOOD PRESSURE KIT) DEVI 1 kit by Does not apply route once a week. Patient not taking: Reported on 09/02/2021 12/23/20   Constant, Peggy, MD  ferrous gluconate (FERGON) 324 MG tablet Take 1 tablet (324 mg total) by mouth every other day. 07/13/21 09/11/21  Warner Mccreedy, MD  furosemide (LASIX) 20 MG tablet Take 1 tablet (20 mg total) by mouth daily for 2 days. 07/13/21 07/15/21  Warner Mccreedy, MD  Ipratropium-Albuterol (COMBIVENT) 20-100 MCG/ACT AERS respimat Inhale 1 puff into the lungs every 6 (six) hours as needed for wheezing or shortness of breath. 04/05/21   Burleson, Brand Males, NP  metroNIDAZOLE (FLAGYL) 500 MG tablet Take 1 tablet (500 mg total) by mouth 2 (two) times daily. 11/29/22   Adam Phenix, MD  NIFEdipine (ADALAT CC) 30 MG 24 hr tablet Take 1 tablet (30 mg total) by mouth daily. Patient not taking: Reported on 09/02/2021 07/13/21 09/11/21  Warner Mccreedy, MD  oxyCODONE (OXY IR/ROXICODONE) 5 MG immediate release tablet Take 1 tablet (5 mg total) by mouth every 6 (six) hours as needed for severe pain. 07/13/21   Warner Mccreedy, MD  polyethylene glycol powder (GLYCOLAX/MIRALAX) 17 GM/SCOOP powder Take 17 g by mouth daily as needed for mild constipation. 07/13/21   Warner Mccreedy, MD  Prenatal Vit-Fe Phos-FA-Omega (VITAFOL GUMMIES) 3.33-0.333-34.8 MG CHEW Chew 3 each by mouth daily. 04/05/21   Currie Paris, NP      Allergies    Cocos nucifera, Ibuprofen, and Mushroom extract complex    Review of Systems   Review of Systems  Neurological:  Positive for headaches.    Physical Exam Updated Vital Signs BP (!) 155/103   Pulse 94   Temp 98.1 F (36.7 C) (Oral)   Resp 18   Ht 5\' 7"  (1.702 m)   Wt 68 kg   SpO2 99%   BMI 23.49 kg/m  Physical Exam Vitals and nursing note reviewed.  Constitutional:      General: She is not in acute distress.    Appearance: Normal appearance. She is normal  weight. She is not ill-appearing.  HENT:     Head: Normocephalic and atraumatic.  Eyes:     Extraocular Movements: Extraocular movements intact.     Pupils: Pupils are equal, round, and reactive to light.  Pulmonary:     Effort: Pulmonary effort is normal. No respiratory distress.  Abdominal:     General: Abdomen is flat.  Musculoskeletal:        General: Normal range of motion.     Cervical back: Neck supple.  Skin:    General: Skin is warm and dry.  Neurological:     Mental Status: She is alert and oriented to person, place, and time.     Comments:   MENTAL STATUS: AAOx3   LANG/SPEECH: Fluent, intact naming, repetition & comprehension   CRANIAL NERVES:   II:  Pupils equal and reactive   III, IV, VI: EOM intact, no gaze preference or deviation, no nystagmus   V: normal sensation of the face   VII: no facial asymmetry   VIII: normal hearing to speech   MOTOR: 5/5 in both upper and lower extremities   SENSORY: Normal to touch in all extremiteis   COORD: Normal finger to nose and shoulder shrug, no tremor, no dysmetria. No pronator drift   Psychiatric:        Mood and Affect: Mood normal.        Behavior: Behavior normal.     ED Results / Procedures / Treatments   Labs (all labs ordered are listed, but only abnormal results are displayed) Labs Reviewed  COMPREHENSIVE METABOLIC PANEL - Abnormal; Notable for the following components:      Result Value   Glucose, Bld 100 (*)    All other components within normal limits  URINALYSIS, W/ REFLEX TO CULTURE (INFECTION SUSPECTED) - Abnormal; Notable for the following components:   APPearance HAZY (*)    Hgb urine dipstick SMALL (*)    Bacteria, UA RARE (*)    All other components within normal limits  CBC WITH DIFFERENTIAL/PLATELET  HCG, SERUM, QUALITATIVE    EKG None  Radiology CT Head Wo Contrast  Result Date: 12/28/2022 CLINICAL DATA:  Recurrent headaches, worsening EXAM: CT HEAD WITHOUT CONTRAST TECHNIQUE: Contiguous axial images were obtained from the base of the skull through the vertex without intravenous contrast. RADIATION DOSE REDUCTION: This exam was performed according to the departmental dose-optimization program which includes automated exposure control, adjustment of the mA and/or kV according to patient size and/or use of iterative reconstruction technique. COMPARISON:  01/23/2020 FINDINGS: Brain: No evidence of acute infarction, hemorrhage, mass, mass effect, or midline shift. No hydrocephalus or extra-axial fluid collection. Vascular: No hyperdense vessel. Skull: Negative for fracture or focal lesion. Sinuses/Orbits: Clear paranasal sinuses.  Normal orbits. Other: The mastoid air  cells are well aerated. IMPRESSION: No acute intracranial process. No etiology is seen for the patient's headaches. Electronically Signed   By: Wiliam Ke M.D.   On: 12/28/2022 18:10    Procedures Procedures    Medications Ordered in ED Medications  magnesium sulfate IVPB 2 g 50 mL (2 g Intravenous New Bag/Given 12/28/22 1744)  ketorolac (TORADOL) 15 MG/ML injection 15 mg (15 mg Intravenous Given 12/28/22 1741)  metoCLOPramide (REGLAN) injection 10 mg (10 mg Intravenous Given 12/28/22 1740)  diphenhydrAMINE (BENADRYL) injection 12.5 mg (12.5 mg Intravenous Given 12/28/22 1742)  sodium chloride 0.9 % bolus 1,000 mL (1,000 mLs Intravenous New Bag/Given 12/28/22 1745)    ED Course/ Medical Decision Making/ A&P  Medical Decision Making Amount and/or Complexity of Data Reviewed Labs: ordered. Radiology: ordered.  Risk Prescription drug management.   This patient presents to the ED for concern of headache, this involves an extensive number of treatment options, and is a complaint that carries with it a high risk of complications and morbidity. Emergent considerations for headache include subarachnoid hemorrhage, meningitis, temporal arteritis, glaucoma, cerebral ischemia, carotid/vertebral dissection, intracranial tumor, Venous sinus thrombosis, carbon monoxide poisoning, acute or chronic subdural hemorrhage.  Other considerations include: Migraine, Cluster headache, Hypertension, Caffeine, alcohol, or drug withdrawal, Pseudotumor cerebri, Arteriovenous malformation, Head injury, Neurocysticercosis, Post-lumbar puncture, Preeclampsia, Tension headache, Sinusitis, Cervical arthritis, Refractive error causing strain, Dental abscess, Otitis media, Temporomandibular joint syndrome, Depression, Somatoform disorder (eg, somatization) Trigeminal neuralgia, Glossopharyngeal neuralgia.   Co morbidities that complicate the patient evaluation   panic attacks, hypertension,  asthma  My initial workup includes labs, imaging, symptom control  Additional history obtained from: Nursing notes from this visit.  I ordered, reviewed and interpreted labs which include: CMP, CBC, urinalysis, hCG.  Labs are reassuring  I ordered imaging studies including CT head I independently visualized and interpreted imaging which showed normal I agree with the radiologist interpretation  Afebrile, hemodynamically stable.  27 year old female presents to the ED for evaluation of a headache.  She has had headaches her entire life.  No significant change recently but states that she has been unable to deal with the headaches with over-the-counter medications.  On exam, she appears very well.  No focal neurologic deficits are present.  She is in no acute distress.  Lab workup was reassuring.  CT head was reassuring as well.  She reported full resolution of her headache after treatment in the ED.  Overall very low suspicion for acute intracranial abnormality as the cause of her headache.  More likely secondary headache such as significant caffeine use or dehydration.  Neurology referral will be placed.  She was encouraged to return to the emergency department with any new or worsening symptoms.  She was otherwise encouraged to follow-up with her PCP and neurology for continued management of her headaches.  She was given return precautions.  Stable at discharge.  At this time there does not appear to be any evidence of an acute emergency medical condition and the patient appears stable for discharge with appropriate outpatient follow up. Diagnosis was discussed with patient who verbalizes understanding of care plan and is agreeable to discharge. I have discussed return precautions with patient who verbalizes understanding. Patient encouraged to follow-up with their PCP within 1 week. All questions answered.  Note: Portions of this report may have been transcribed using voice recognition software.  Every effort was made to ensure accuracy; however, inadvertent computerized transcription errors may still be present.        Final Clinical Impression(s) / ED Diagnoses Final diagnoses:  Bad headache    Rx / DC Orders ED Discharge Orders          Ordered    Ambulatory referral to Neurology       Comments: An appointment is requested in approximately: 2 weeks   12/28/22 1843              Mora Bellman 12/28/22 1844    Loetta Rough, MD 12/28/22 [phone]

## 2023-01-12 ENCOUNTER — Encounter (HOSPITAL_COMMUNITY): Payer: Self-pay

## 2023-01-12 ENCOUNTER — Ambulatory Visit (HOSPITAL_COMMUNITY)
Admission: EM | Admit: 2023-01-12 | Discharge: 2023-01-12 | Disposition: A | Discharge status: Home / Self Care | Payer: BLUE CROSS/BLUE SHIELD | Attending: Family Medicine | Admitting: Family Medicine

## 2023-01-12 DIAGNOSIS — L292 Pruritus vulvae: Secondary | ICD-10-CM | POA: Diagnosis present

## 2023-01-12 DIAGNOSIS — R300 Dysuria: Secondary | ICD-10-CM | POA: Insufficient documentation

## 2023-01-12 DIAGNOSIS — N760 Acute vaginitis: Secondary | ICD-10-CM | POA: Insufficient documentation

## 2023-01-12 DIAGNOSIS — R350 Frequency of micturition: Secondary | ICD-10-CM | POA: Diagnosis not present

## 2023-01-12 LAB — CERVICOVAGINAL ANCILLARY ONLY
Bacterial Vaginitis (gardnerella): NEGATIVE
Candida Glabrata: NEGATIVE
Candida Vaginitis: POSITIVE — AB
Chlamydia: NEGATIVE
Comment: NEGATIVE
Comment: NEGATIVE
Comment: NEGATIVE
Comment: NEGATIVE
Comment: NEGATIVE
Comment: NORMAL
Neisseria Gonorrhea: NEGATIVE
Trichomonas: NEGATIVE

## 2023-01-12 LAB — POCT URINALYSIS DIP (MANUAL ENTRY)
Bilirubin, UA: NEGATIVE
Glucose, UA: NEGATIVE mg/dL
Ketones, POC UA: NEGATIVE mg/dL
Leukocytes, UA: NEGATIVE
Nitrite, UA: NEGATIVE
Protein Ur, POC: NEGATIVE mg/dL
Spec Grav, UA: >=1.03 — AB (ref 1.010–1.025)
Urobilinogen, UA: 0.2 E.U./dL
pH, UA: 5.5 (ref 5.0–8.0)

## 2023-01-12 LAB — POCT URINE PREGNANCY: Preg Test, Ur: NEGATIVE

## 2023-01-12 MED ORDER — CLOTRIMAZOLE 1 % EX CREA: TOPICAL_CREAM | CUTANEOUS | 0 refills | Status: DC

## 2023-01-12 NOTE — ED Triage Notes (Signed)
Patient c/o vaginal itching, burning, and bleeding from scratching x 2 days. Patient states worse today.   Patient denies taking any OTC meds for her symptoms.

## 2023-01-12 NOTE — ED Provider Notes (Signed)
[facility]    CSN: [id number] Arrival date & time: 01/12/23  1033      History   Chief Complaint Chief Complaint  Patient presents with   Vaginal Itching    HPI Mackenzie Schultz is a 27 y.o. female.    Vaginal Itching  Patient is here for possible yeast/uti.  She is having vaginal itching/burning.  Wiping is irritating, burning with urination. No belly or back pain but is having urinary frequency.  No vaginal d/c.  Sexually active.        Past Medical History:  Diagnosis Date   Asthma    Heart murmur    Hypertension    Panic attacks     Patient Active Problem List   Diagnosis Date Noted   Acute blood loss as cause of postoperative anemia 07/11/2021   Cesarean delivery delivered 07/10/2021   Heart murmur 02/08/2021   Chronic hypertension in obstetric context in second trimester 02/08/2021   Maternal obesity affecting pregnancy, antepartum 01/11/2021   Supervision of high risk pregnancy, antepartum 12/23/2020    Past Surgical History:  Procedure Laterality Date   CESAREAN SECTION N/A 07/10/2021   Procedure: CESAREAN SECTION;  Surgeon: Milas Hock, MD;  Location: [facility];  Service: Obstetrics;  Laterality: N/A;    OB History     Gravida  1   Para  1   Term  1   Preterm  0   AB  0   Living  1      SAB  0   IAB  0   Ectopic  0   Multiple  0   Live Births  1            Home Medications    Prior to Admission medications   Medication Sig Start Date End Date Taking? Authorizing Provider  acetaminophen (TYLENOL) 500 MG tablet Take 2 tablets (1,000 mg total) by mouth every 6 (six) hours. 07/13/21   Warner Mccreedy, MD  Blood Pressure Monitoring (BLOOD PRESSURE KIT) DEVI 1 kit by Does not apply route once a week. Patient not taking: Reported on 09/02/2021 12/23/20   Constant, Peggy, MD  ferrous gluconate (FERGON) 324 MG tablet Take 1 tablet (324 mg total) by mouth every other day. 07/13/21 09/11/21  Warner Mccreedy, MD  furosemide  (LASIX) 20 MG tablet Take 1 tablet (20 mg total) by mouth daily for 2 days. 07/13/21 07/15/21  Warner Mccreedy, MD  Ipratropium-Albuterol (COMBIVENT) 20-100 MCG/ACT AERS respimat Inhale 1 puff into the lungs every 6 (six) hours as needed for wheezing or shortness of breath. 04/05/21   Burleson, Brand Males, NP  metroNIDAZOLE (FLAGYL) 500 MG tablet Take 1 tablet (500 mg total) by mouth 2 (two) times daily. 11/29/22   Adam Phenix, MD  NIFEdipine (ADALAT CC) 30 MG 24 hr tablet Take 1 tablet (30 mg total) by mouth daily. Patient not taking: Reported on 09/02/2021 07/13/21 09/11/21  Warner Mccreedy, MD  oxyCODONE (OXY IR/ROXICODONE) 5 MG immediate release tablet Take 1 tablet (5 mg total) by mouth every 6 (six) hours as needed for severe pain. 07/13/21   Warner Mccreedy, MD  polyethylene glycol powder (GLYCOLAX/MIRALAX) 17 GM/SCOOP powder Take 17 g by mouth daily as needed for mild constipation. 07/13/21   Warner Mccreedy, MD  Prenatal Vit-Fe Phos-FA-Omega (VITAFOL GUMMIES) 3.33-0.333-34.8 MG CHEW Chew 3 each by mouth daily. 04/05/21   Currie Paris, NP    Family History Family History  Problem Relation Age of Onset  Hypertension Mother    Hypertension Father    Hypertension Other     Social History Social History   Tobacco Use   Smoking status: Former    Types: Cigars   Smokeless tobacco: Never  Vaping Use   Vaping status: Never Used  Substance Use Topics   Alcohol use: Not Currently    Comment: Occasional, not since confirmed pregnancy   Drug use: No     Allergies   Cocos nucifera, Ibuprofen, and Mushroom extract complex   Review of Systems Review of Systems  Constitutional: Negative.   HENT: Negative.    Respiratory: Negative.    Cardiovascular: Negative.   Gastrointestinal: Negative.   Genitourinary:  Positive for dysuria, frequency and vaginal pain. Negative for flank pain and vaginal discharge.  Musculoskeletal: Negative.   Psychiatric/Behavioral: Negative.       Physical Exam Triage  Vital Signs ED Triage Vitals  Encounter Vitals Group     BP 01/12/23 1100 136/89     Systolic BP Percentile --      Diastolic BP Percentile --      Pulse Rate 01/12/23 1100 82     Resp 01/12/23 1100 16     Temp 01/12/23 1100 98.3 F (36.8 C)     Temp Source 01/12/23 1100 Oral     SpO2 01/12/23 1100 99 %     Weight --      Height --      Head Circumference --      Peak Flow --      Pain Score 01/12/23 1102 9     Pain Loc --      Pain Education --      Exclude from Growth Chart --    No data found.  Updated Vital Signs BP 136/89 (BP Location: Left Arm)   Pulse 82   Temp 98.3 F (36.8 C) (Oral)   Resp 16   SpO2 99%   Visual Acuity Right Eye Distance:   Left Eye Distance:   Bilateral Distance:    Right Eye Near:   Left Eye Near:    Bilateral Near:     Physical Exam Constitutional:      Appearance: Normal appearance.  Cardiovascular:     Rate and Rhythm: Normal rate and regular rhythm.  Pulmonary:     Effort: Pulmonary effort is normal.     Breath sounds: Normal breath sounds.  Abdominal:     Palpations: Abdomen is soft.     Tenderness: There is no abdominal tenderness. There is no guarding.  Neurological:     General: No focal deficit present.     Mental Status: She is alert.  Psychiatric:        Mood and Affect: Mood normal.      UC Treatments / Results  Labs (all labs ordered are listed, but only abnormal results are displayed) Labs Reviewed  POCT URINALYSIS DIP (MANUAL ENTRY) - Abnormal; Notable for the following components:      Result Value   Clarity, UA cloudy (*)    Spec Grav, UA >=1.030 (*)    Blood, UA small (*)    All other components within normal limits  URINE CULTURE  POCT URINE PREGNANCY  CERVICOVAGINAL ANCILLARY ONLY   UPT negative EKG   Radiology No results found.  Procedures Procedures (including critical care time)  Medications Ordered in UC Medications - No data to display  Initial Impression / Assessment and Plan /  UC Course  I have reviewed the  triage vital signs and the nursing notes.  Pertinent labs & imaging results that were available during my care of the patient were reviewed by me and considered in my medical decision making (see chart for details).   Final Clinical Impressions(s) / UC Diagnoses   Final diagnoses:  Vaginitis and vulvovaginitis     Discharge Instructions      You were seen today for vaginal and urinary symptoms.  I have sent out a cream to use twice/day to help soothe the area.  Your vaginal swab will be resulted tomorrow and if anything is positive we will call to notify you for treatment.  Your urine does not show any overt infection, but will be sent of culture.  If treatment is needed you will be notified as well.      ED Prescriptions     Medication Sig Dispense Auth. Provider   clotrimazole (LOTRIMIN) 1 % cream Apply to affected area 2 times daily 15 g Jannifer Franklin, MD      PDMP not reviewed this encounter.   Jannifer Franklin, MD 01/12/23 1143

## 2023-01-12 NOTE — Discharge Instructions (Addendum)
You were seen today for vaginal and urinary symptoms.  I have sent out a cream to use twice/day to help soothe the area.  Your vaginal swab will be resulted tomorrow and if anything is positive we will call to notify you for treatment.  Your urine does not show any overt infection, but will be sent of culture.  If treatment is needed you will be notified as well.

## 2023-01-13 ENCOUNTER — Telehealth: Payer: Self-pay

## 2023-01-13 MED ORDER — FLUCONAZOLE 150 MG PO TABS: 150.0000 mg | ORAL_TABLET | Freq: Once | ORAL | 0 refills | Status: AC

## 2023-01-13 NOTE — Telephone Encounter (Signed)
 Per protocol, pt requires tx with Diflucan. Reviewed with patient, verified pharmacy, prescription sent.

## 2023-01-14 LAB — URINE CULTURE: Culture: NO GROWTH

## 2023-01-19 ENCOUNTER — Other Ambulatory Visit (HOSPITAL_COMMUNITY): Payer: Self-pay

## 2023-01-19 ENCOUNTER — Other Ambulatory Visit: Payer: Self-pay | Admitting: Obstetrics and Gynecology

## 2023-01-19 ENCOUNTER — Other Ambulatory Visit: Payer: Self-pay

## 2023-01-19 DIAGNOSIS — Z3481 Encounter for supervision of other normal pregnancy, first trimester: Secondary | ICD-10-CM

## 2023-01-20 ENCOUNTER — Other Ambulatory Visit: Payer: Self-pay | Admitting: Obstetrics

## 2023-01-20 ENCOUNTER — Other Ambulatory Visit: Payer: Self-pay

## 2023-01-20 ENCOUNTER — Other Ambulatory Visit (HOSPITAL_COMMUNITY): Payer: Self-pay

## 2023-01-20 MED ORDER — BLOOD PRESSURE KIT DEVI
1.0000 | 0 refills | Status: AC
  Filled 2023-01-20: qty 1, 30d supply, fill #0

## 2023-01-21 ENCOUNTER — Other Ambulatory Visit (HOSPITAL_COMMUNITY): Payer: Self-pay

## 2023-01-21 MED ORDER — NIFEDIPINE ER 30 MG PO TB24
30.0000 mg | ORAL_TABLET | Freq: Every day | ORAL | 0 refills | Status: DC
  Filled 2023-01-21: qty 60, 60d supply, fill #0

## 2023-01-22 ENCOUNTER — Other Ambulatory Visit: Payer: Self-pay

## 2023-01-23 ENCOUNTER — Other Ambulatory Visit: Payer: Self-pay

## 2023-01-23 ENCOUNTER — Other Ambulatory Visit (HOSPITAL_COMMUNITY): Payer: Self-pay

## 2023-01-24 ENCOUNTER — Other Ambulatory Visit: Payer: Self-pay

## 2023-01-24 ENCOUNTER — Encounter: Payer: Self-pay | Admitting: Obstetrics

## 2023-01-24 ENCOUNTER — Other Ambulatory Visit (HOSPITAL_COMMUNITY): Payer: Self-pay

## 2023-01-24 ENCOUNTER — Encounter: Payer: Self-pay | Admitting: Pharmacist

## 2023-01-27 ENCOUNTER — Other Ambulatory Visit: Payer: Self-pay

## 2023-02-15 IMAGING — CT CT RENAL STONE PROTOCOL
2 of 4 series · 17 of 46 positions shown, 19 images · non-contrast
Comparison: None.

CLINICAL DATA: Acute right flank pain. No history of kidney stones.

EXAM:
CT ABDOMEN AND PELVIS WITHOUT CONTRAST
TECHNIQUE: Multidetector CT imaging of the abdomen and pelvis was performed
following the standard protocol without IV contrast.

[Series 2: axial st · axial · 0.67mm/px · z∈[-470,-70]mm · 14 of 92 slices shown, 16 images]
[im 6/92  soft-tissue]
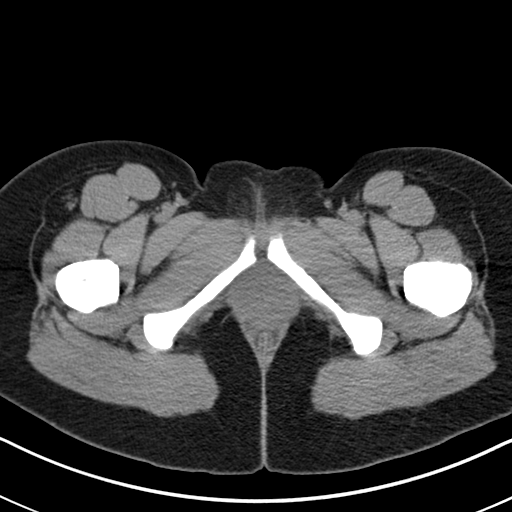
[im 6/92  bone]
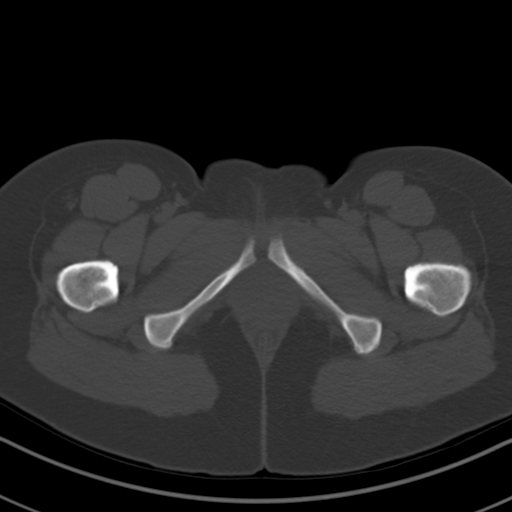
[im 11/92  soft-tissue]
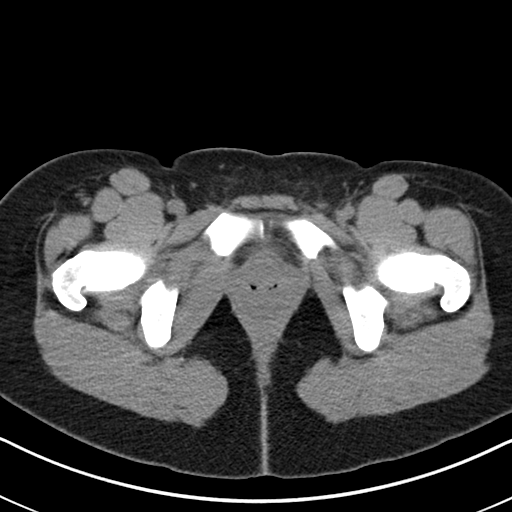
[im 21/92  soft-tissue]
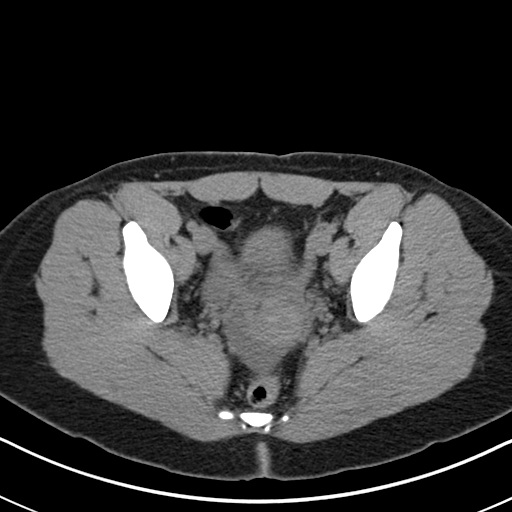
[im 26/92  soft-tissue]
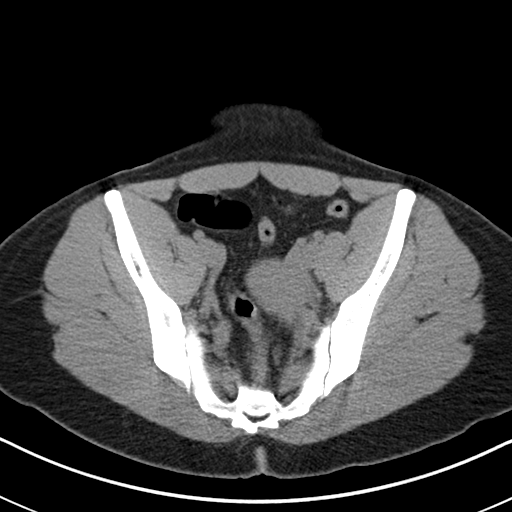
[im 31/92  soft-tissue]
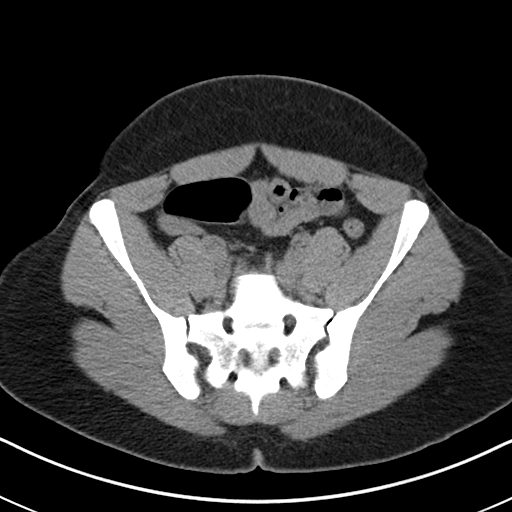
[im 36/92  soft-tissue]
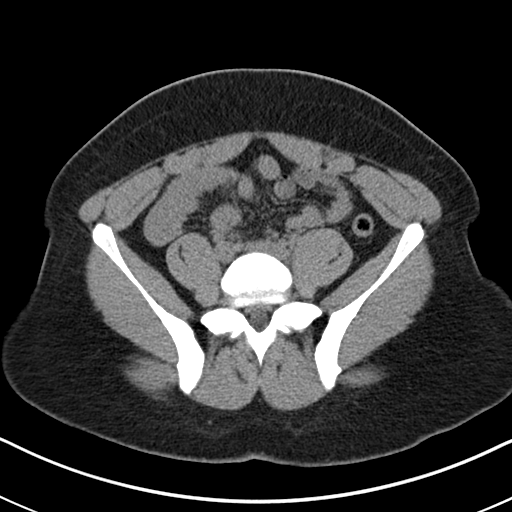
[im 41/92  soft-tissue]
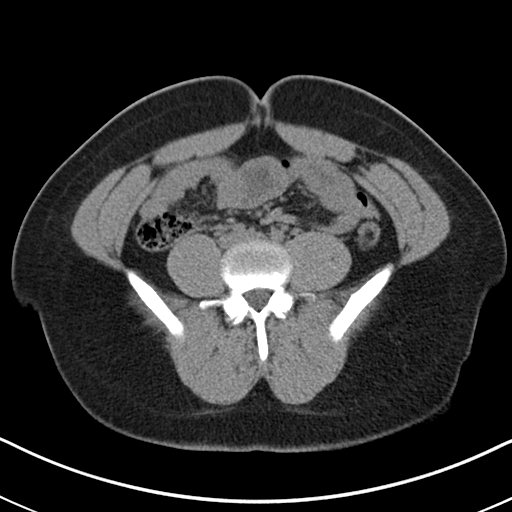
[im 51/92  soft-tissue]
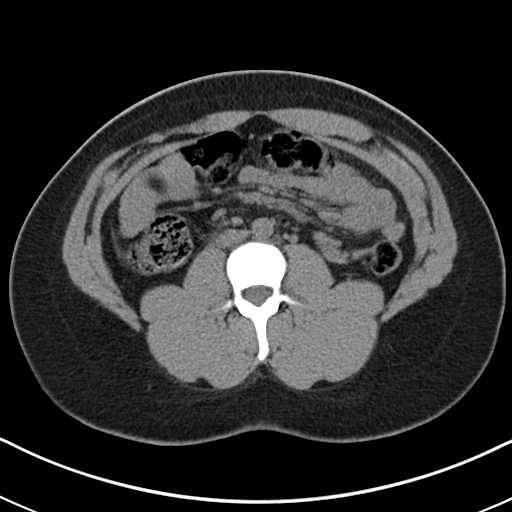
[im 56/92  soft-tissue]
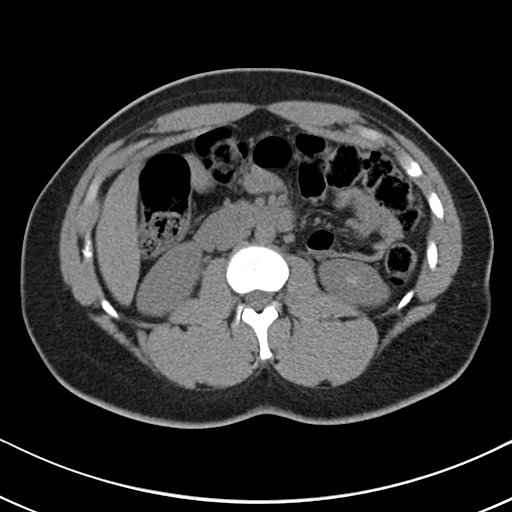
[im 56/92  bone]
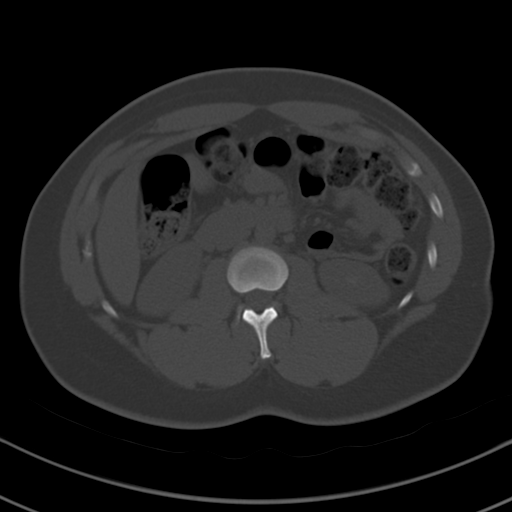
[im 61/92  soft-tissue]
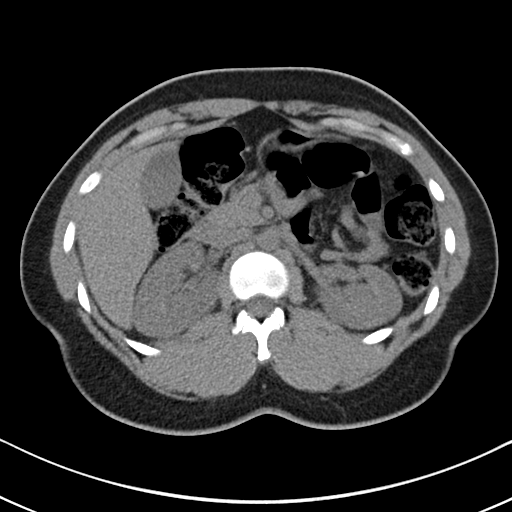
[im 66/92  soft-tissue]
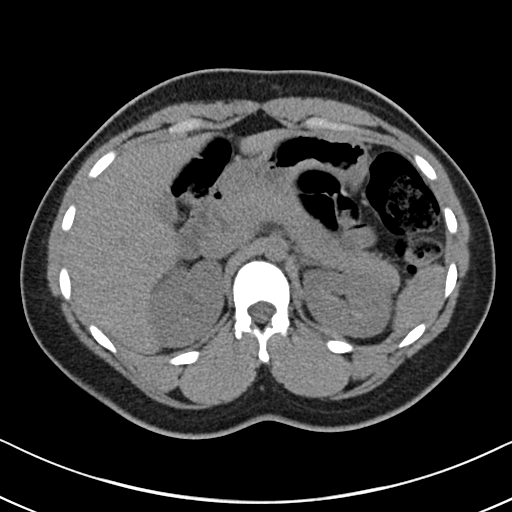
[im 71/92  soft-tissue]
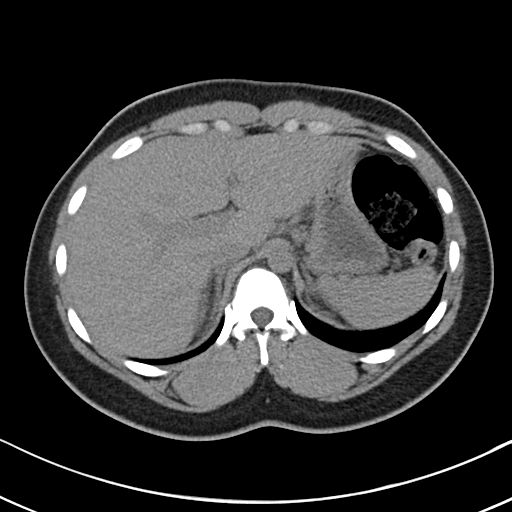
[im 81/92  soft-tissue]
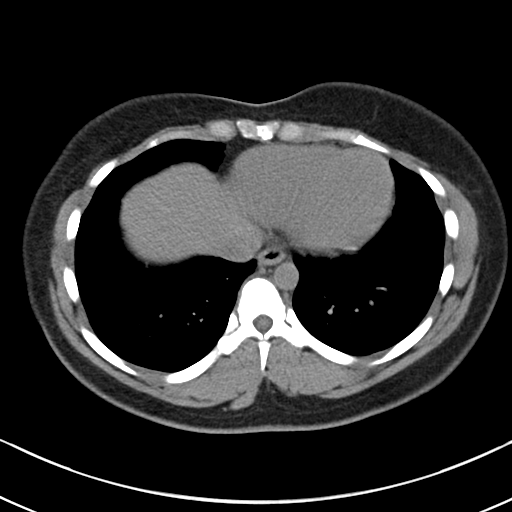
[im 86/92  soft-tissue]
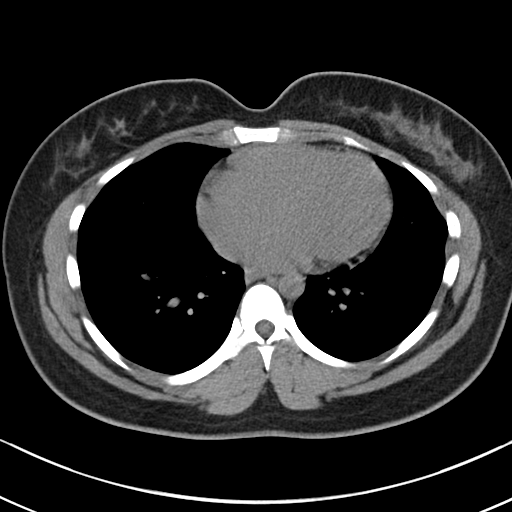

[Series 4: coronal · coronal · 0.79mm/px · 3 of 137 slices shown]
[im 46/137  soft-tissue]
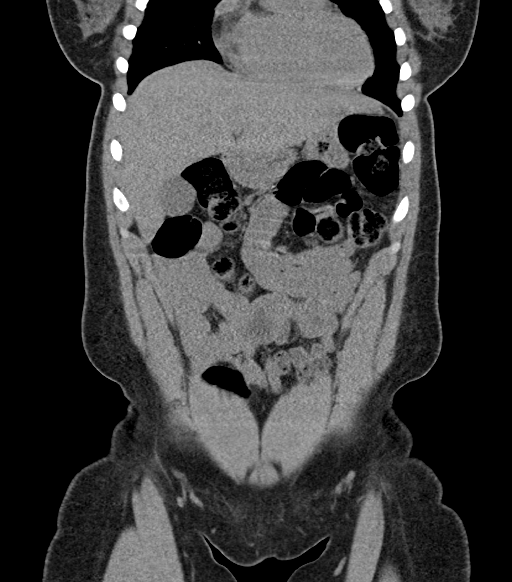
[im 61/137  soft-tissue]
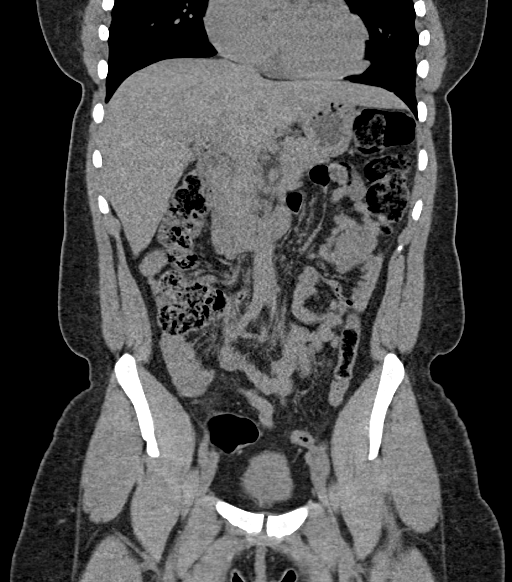
[im 76/137  soft-tissue]
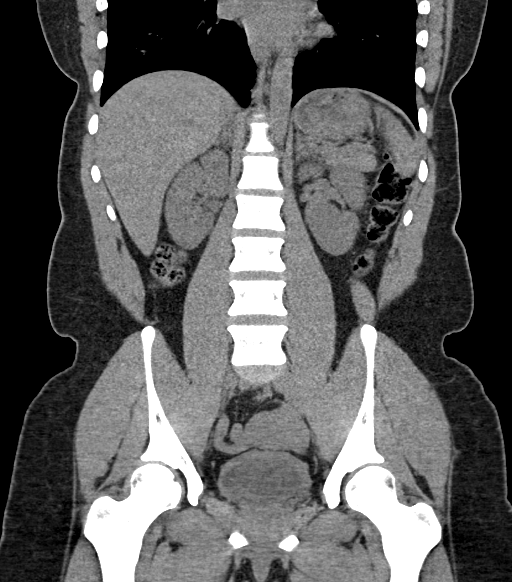

[17 of 46 positions shown; findings below may reference images not displayed]

FINDINGS: Lower chest: No acute abnormality.

Hepatobiliary: No focal liver abnormality is seen. No gallstones,
gallbladder wall thickening, or biliary dilatation.

Pancreas: Unremarkable. No pancreatic ductal dilatation or
surrounding inflammatory changes.

Spleen: Normal in size without focal abnormality.

Adrenals/Urinary Tract: Adrenal glands are unremarkable. Subtle
increased density in the left renal medullary pyramids. No renal
calculi, focal lesion, or hydronephrosis. Bladder is under
distended.

Stomach/Bowel: Stomach is within normal limits. Appendix appears
normal. No evidence of bowel wall thickening, distention, or
inflammatory changes.

Vascular/Lymphatic: No significant vascular findings are present. No
enlarged abdominal or pelvic lymph nodes.

Reproductive: Uterus and bilateral adnexa are unremarkable.

Other: Small free fluid in the pelvis is likely physiologic. No
pneumoperitoneum.

Musculoskeletal: No acute or significant osseous findings.
IMPRESSION: 1. No acute intra-abdominal process. No obstructive uropathy.
2. Subtle increased density in the left renal medullary pyramids may
reflect early medullary nephrocalcinosis.

## 2023-02-27 ENCOUNTER — Other Ambulatory Visit: Payer: Self-pay

## 2023-02-27 ENCOUNTER — Encounter (HOSPITAL_COMMUNITY): Payer: Self-pay | Admitting: *Deleted

## 2023-02-27 ENCOUNTER — Ambulatory Visit (HOSPITAL_COMMUNITY)
Admission: EM | Admit: 2023-02-27 | Discharge: 2023-02-27 | Disposition: A | Discharge status: Home / Self Care | Payer: BLUE CROSS/BLUE SHIELD | Attending: Emergency Medicine | Admitting: Emergency Medicine

## 2023-02-27 DIAGNOSIS — M542 Cervicalgia: Secondary | ICD-10-CM | POA: Diagnosis not present

## 2023-02-27 DIAGNOSIS — M549 Dorsalgia, unspecified: Secondary | ICD-10-CM

## 2023-02-27 MED ORDER — CARBAMIDE PEROXIDE 6.5 % OT SOLN: 5.0000 [drp] | Freq: Two times a day (BID) | OTIC | 0 refills | Status: DC

## 2023-02-27 MED ORDER — KETOROLAC TROMETHAMINE 30 MG/ML IJ SOLN
30.0000 mg | Freq: Once | INTRAMUSCULAR | Status: AC
  Administered 2023-02-27: 30 mg via INTRAMUSCULAR

## 2023-02-27 MED ORDER — KETOROLAC TROMETHAMINE 30 MG/ML IJ SOLN
INTRAMUSCULAR | Status: AC
  Filled 2023-02-27: qty 1

## 2023-02-27 MED ORDER — LIDOCAINE 5 % EX PTCH: 1.0000 | MEDICATED_PATCH | CUTANEOUS | 0 refills | Status: DC

## 2023-02-27 NOTE — ED Triage Notes (Signed)
Pt reports neck and upper back pain that started last night. Pt reports the Pain started last week and went away. Pain returned last night.

## 2023-02-27 NOTE — ED Provider Notes (Signed)
[facility]    CSN: [id number] Arrival date & time: 02/27/23  1156      History   Chief Complaint Chief Complaint  Patient presents with   Back Pain   Neck Pain    HPI Mackenzie Schultz is a 27 y.o. female.  Last night developed left side neck and upper back pain. Rating 10/10 with movement  Last night took ibuprofen, tylenol, and a muscle relaxer. Denies injury or trauma. No heavy lifting She had similar symptoms a week ago that resolved on their own  Not having fever or chills. No headache, dizziness, vision changes. No weakness of extremities. No nausea/vomiting  She does have history of chronic headaches, was referred to neurology. Appointment next month   Past Medical History:  Diagnosis Date   Asthma    Heart murmur    Hypertension    Panic attacks     Patient Active Problem List   Diagnosis Date Noted   Acute blood loss as cause of postoperative anemia 07/11/2021   Cesarean delivery delivered 07/10/2021   Heart murmur 02/08/2021   Chronic hypertension in obstetric context in second trimester 02/08/2021   Maternal obesity affecting pregnancy, antepartum 01/11/2021   Supervision of high risk pregnancy, antepartum 12/23/2020    Past Surgical History:  Procedure Laterality Date   CESAREAN SECTION N/A 07/10/2021   Procedure: CESAREAN SECTION;  Surgeon: Milas Hock, MD;  Location: [facility];  Service: Obstetrics;  Laterality: N/A;    OB History     Gravida  1   Para  1   Term  1   Preterm  0   AB  0   Living  1      SAB  0   IAB  0   Ectopic  0   Multiple  0   Live Births  1            Home Medications    Prior to Admission medications   Medication Sig Start Date End Date Taking? Authorizing Provider  acetaminophen (TYLENOL) 500 MG tablet Take 2 tablets (1,000 mg total) by mouth every 6 (six) hours. 07/13/21  Yes Warner Mccreedy, MD  Blood Pressure Monitoring (BLOOD PRESSURE KIT) DEVI Use as directed once a week  01/20/23  Yes Constant, Peggy, MD  lidocaine (LIDODERM) 5 % Place 1 patch onto the skin daily. Remove & Discard patch within 12 hours 02/27/23  Yes Har[place] Thresher, Lurena Joiner, PA-C  NIFEdipine (ADALAT CC) 30 MG 24 hr tablet Take 1 tablet (30 mg total) by mouth daily. 01/21/23 03/25/23 Yes Reva Bores, MD  polyethylene glycol powder (GLYCOLAX/MIRALAX) 17 GM/SCOOP powder Take 17 g by mouth daily as needed for mild constipation. 07/13/21  Yes Warner Mccreedy, MD  Ipratropium-Albuterol (COMBIVENT) 20-100 MCG/ACT AERS respimat Inhale 1 puff into the lungs every 6 (six) hours as needed for wheezing or shortness of breath. 04/05/21   Currie Paris, NP    Family History Family History  Problem Relation Age of Onset   Hypertension Mother    Hypertension Father    Hypertension Other     Social History Social History   Tobacco Use   Smoking status: Former    Types: Cigars   Smokeless tobacco: Never  Vaping Use   Vaping status: Never Used  Substance Use Topics   Alcohol use: Not Currently    Comment: Occasional, not since confirmed pregnancy   Drug use: No     Allergies   Cocos nucifera, Ibuprofen, and Mushroom  extract complex   Review of Systems Review of Systems  Musculoskeletal:  Positive for back pain and neck pain.   Per HPI  Physical Exam Triage Vital Signs ED Triage Vitals [02/27/23 1346]  Encounter Vitals Group     BP      Systolic BP Percentile      Diastolic BP Percentile      Pulse      Resp      Temp      Temp src      SpO2      Weight      Height      Head Circumference      Peak Flow      Pain Score 9     Pain Loc      Pain Education      Exclude from Growth Chart    No data found.  Updated Vital Signs BP 129/89   Pulse 94   Temp 98.3 F (36.8 C)   Resp 18   LMP 02/16/2023   SpO2 98%   Breastfeeding No   Physical Exam Vitals and nursing note reviewed.  Constitutional:      General: She is not in acute distress. HENT:     Nose: Nose normal.      Mouth/Throat:     Mouth: Mucous membranes are moist.     Pharynx: Oropharynx is clear.  Eyes:     Extraocular Movements: Extraocular movements intact.     Conjunctiva/sclera: Conjunctivae normal.     Pupils: Pupils are equal, round, and reactive to light.  Neck:     Comments: Decreased extension and looking left. She can fully flex neck and look right. There is muscular tenderness of the right trapezius and right upper back. No spinal tenderness. Tightness/spasm felt upper trap Cardiovascular:     Rate and Rhythm: Normal rate and regular rhythm.     Pulses: Normal pulses.     Heart sounds: Normal heart sounds.  Pulmonary:     Effort: Pulmonary effort is normal.     Breath sounds: Normal breath sounds.  Abdominal:     Palpations: Abdomen is soft.     Tenderness: There is no abdominal tenderness.  Musculoskeletal:     Cervical back: Tenderness present. No rigidity or torticollis. Muscular tenderness present. No spinous process tenderness. Decreased range of motion.  Skin:    General: Skin is warm and dry.     Capillary Refill: Capillary refill takes less than 2 seconds.     Findings: No bruising.  Neurological:     General: No focal deficit present.     Mental Status: She is alert and oriented to person, place, and time.     Comments: Full ROM of extremities. Sensation intact throughout. Strength 5/5     UC Treatments / Results  Labs (all labs ordered are listed, but only abnormal results are displayed) Labs Reviewed - No data to display  EKG   Radiology No results found.  Procedures Procedures (including critical care time)  Medications Ordered in UC Medications  ketorolac (TORADOL) 30 MG/ML injection 30 mg (30 mg Intramuscular Given 02/27/23 1430)    Initial Impression / Assessment and Plan / UC Course  I have reviewed the triage vital signs and the nursing notes.  Pertinent labs & imaging results that were available during my care of the patient were reviewed by  me and considered in my medical decision making (see chart for details).  Afebrile, neurologically intact. Mostly left  sided and likely muscular. No bony tenderness or injury to warrant xray at this time.  Toradol IM given in clinic with much improvement. Pain is down to 4/10 and patient has almost full ROM. Discussed tylenol today, can use ibuprofen tomorrow (with food). She has flexeril 10 mg at home that makes her drowsy, recommend take at bedtime. Try topical therapies such as lidocaine patches. Provided with gentle neck exercises. Return and ED precautions. Patient agreeable to plan, no questions at this time    Final Clinical Impressions(s) / UC Diagnoses   Final diagnoses:  Neck pain  Upper back pain on left side     Discharge Instructions      Please do not use any NSAIDs (ibuprofen/Advil, naproxen/Aleve, etc) for the next 12 hours. You can safely use tylenol.   You can take your flexeril 10 mg at bedtime.  Lidocaine patch can be applied for 12 hours at a time  Avoid heavy lifting and strenuous activity  It may take several days to a week for symptoms to improve.  Please return or follow up with your primary care provider if needed.     ED Prescriptions     Medication Sig Dispense Auth. Provider   carbamide peroxide (DEBROX) 6.5 % OTIC solution  (Status: Discontinued) Place 5 drops into the right ear 2 (two) times daily. 15 mL Sanjna Haskew, PA-C   lidocaine (LIDODERM) 5 % Place 1 patch onto the skin daily. Remove & Discard patch within 12 hours 14 patch Tyden Kann, PA-C      I have reviewed the PDMP during this encounter.   Marlow Baars, [place] 02/27/23 1523

## 2023-02-27 NOTE — Discharge Instructions (Addendum)
Please do not use any NSAIDs (ibuprofen/Advil, naproxen/Aleve, etc) for the next 12 hours. You can safely use tylenol.   You can take your flexeril 10 mg at bedtime.  Lidocaine patch can be applied for 12 hours at a time  Avoid heavy lifting and strenuous activity  It may take several days to a week for symptoms to improve.  Please return or follow up with your primary care provider if needed.

## 2023-03-08 ENCOUNTER — Other Ambulatory Visit (HOSPITAL_COMMUNITY): Payer: Self-pay

## 2023-03-18 ENCOUNTER — Other Ambulatory Visit: Payer: Self-pay | Admitting: Family Medicine

## 2023-03-18 ENCOUNTER — Other Ambulatory Visit (HOSPITAL_COMMUNITY): Payer: Self-pay

## 2023-03-22 ENCOUNTER — Ambulatory Visit (INDEPENDENT_AMBULATORY_CARE_PROVIDER_SITE_OTHER): Payer: BLUE CROSS/BLUE SHIELD

## 2023-03-22 ENCOUNTER — Telehealth: Payer: Self-pay | Admitting: Podiatry

## 2023-03-22 ENCOUNTER — Encounter (HOSPITAL_COMMUNITY): Payer: Self-pay

## 2023-03-22 ENCOUNTER — Ambulatory Visit (HOSPITAL_COMMUNITY)
Admission: EM | Admit: 2023-03-22 | Discharge: 2023-03-22 | Disposition: A | Discharge status: Home / Self Care | Payer: BLUE CROSS/BLUE SHIELD | Attending: Internal Medicine | Admitting: Internal Medicine

## 2023-03-22 DIAGNOSIS — M79674 Pain in right toe(s): Secondary | ICD-10-CM | POA: Diagnosis present

## 2023-03-22 DIAGNOSIS — Z20822 Contact with and (suspected) exposure to covid-19: Secondary | ICD-10-CM | POA: Diagnosis not present

## 2023-03-22 NOTE — Telephone Encounter (Signed)
Patient called stating she was sent from Emergency medicine in regards to her toes swelling and a constant pain. Patient stated she can't bend her toe and that there is a throbbing sensation. The patient also stated she's been icing and elevating her foot prior to going to the emergency room and taking ibuprofen/tylenol (none of this has helped her). She stated while at her visit in the emergency room all they did was take xray's, they did not give her any pain relief medications to help. She is on your schedule for tomorrow at 11 but would like some assistance before then. She uses the [facility] on [address] if you do decide to send something in.    Thank You

## 2023-03-22 NOTE — Discharge Instructions (Signed)
X-ray was normal.  Recommend elevation and ice application.  Follow-up with foot doctor.

## 2023-03-22 NOTE — ED Triage Notes (Signed)
Patient here today with c/o right 2nd toe pain that has been going on for a while but has worsened since last night. Patient cannot recall a specific injury but does notice some swelling. Patient states that she works at [employer]. She has tried taking Tylenol and IBU with no relief.

## 2023-03-22 NOTE — ED Provider Notes (Signed)
[facility]    CSN: [id number] Arrival date & time: 03/22/23  1233      History   Chief Complaint Chief Complaint  Patient presents with   Toe Injury    HPI Mackenzie Schultz is a 27 y.o. female.   Patient presents with right second toe pain that has been ongoing for "a while" but is worsened over the past few days.  She denies any obvious injury to the toe.  Patient also requesting COVID testing given family member tested positive for COVID.  Denies any associated symptoms.     Past Medical History:  Diagnosis Date   Asthma    Heart murmur    Hypertension    Panic attacks     Patient Active Problem List   Diagnosis Date Noted   Acute blood loss as cause of postoperative anemia 07/11/2021   Cesarean delivery delivered 07/10/2021   Heart murmur 02/08/2021   Chronic hypertension in obstetric context in second trimester 02/08/2021   Maternal obesity affecting pregnancy, antepartum 01/11/2021   Supervision of high risk pregnancy, antepartum 12/23/2020    Past Surgical History:  Procedure Laterality Date   CESAREAN SECTION N/A 07/10/2021   Procedure: CESAREAN SECTION;  Surgeon: Milas Hock, MD;  Location: [facility];  Service: Obstetrics;  Laterality: N/A;    OB History     Gravida  1   Para  1   Term  1   Preterm  0   AB  0   Living  1      SAB  0   IAB  0   Ectopic  0   Multiple  0   Live Births  1            Home Medications    Prior to Admission medications   Medication Sig Start Date End Date Taking? Authorizing Provider  acetaminophen (TYLENOL) 500 MG tablet Take 2 tablets (1,000 mg total) by mouth every 6 (six) hours. 07/13/21   Warner Mccreedy, MD  Blood Pressure Monitoring (BLOOD PRESSURE KIT) DEVI Use as directed once a week 01/20/23   Constant, Peggy, MD  Ipratropium-Albuterol (COMBIVENT) 20-100 MCG/ACT AERS respimat Inhale 1 puff into the lungs every 6 (six) hours as needed for wheezing or shortness of breath.  04/05/21   Burleson, Brand Males, NP  lidocaine (LIDODERM) 5 % Place 1 patch onto the skin daily. Remove & Discard patch within 12 hours 02/27/23   Rising, Lurena Joiner, PA-C  NIFEdipine (ADALAT CC) 30 MG 24 hr tablet Take 1 tablet (30 mg total) by mouth daily. 01/21/23 03/25/23  Reva Bores, MD  polyethylene glycol powder (GLYCOLAX/MIRALAX) 17 GM/SCOOP powder Take 17 g by mouth daily as needed for mild constipation. 07/13/21   Warner Mccreedy, MD    Family History Family History  Problem Relation Age of Onset   Hypertension Mother    Hypertension Father    Hypertension Other     Social History Social History   Tobacco Use   Smoking status: Former    Types: Cigars   Smokeless tobacco: Never  Vaping Use   Vaping status: Never Used  Substance Use Topics   Alcohol use: Not Currently    Comment: Occasional, not since confirmed pregnancy   Drug use: No     Allergies   Cocos nucifera, Ibuprofen, and Mushroom extract complex   Review of Systems Review of Systems Per HPI  Physical Exam Triage Vital Signs ED Triage Vitals  Encounter Vitals Group  BP 03/22/23 1259 125/83     Systolic BP Percentile --      Diastolic BP Percentile --      Pulse Rate 03/22/23 1259 84     Resp 03/22/23 1259 16     Temp 03/22/23 1259 98.3 F (36.8 C)     Temp Source 03/22/23 1259 Oral     SpO2 03/22/23 1259 96 %     Weight --      Height 03/22/23 1259 5\' 7"  (1.702 m)     Head Circumference --      Peak Flow --      Pain Score 03/22/23 1258 8     Pain Loc --      Pain Education --      Exclude from Growth Chart --    No data found.  Updated Vital Signs BP 125/83 (BP Location: Right Arm)   Pulse 84   Temp 98.3 F (36.8 C) (Oral)   Resp 16   Ht 5\' 7"  (1.702 m)   LMP 03/08/2023 (Approximate)   SpO2 96%   Breastfeeding No   BMI 23.49 kg/m   Visual Acuity Right Eye Distance:   Left Eye Distance:   Bilateral Distance:    Right Eye Near:   Left Eye Near:    Bilateral Near:      Physical Exam Constitutional:      General: She is not in acute distress.    Appearance: Normal appearance. She is not toxic-appearing or diaphoretic.  HENT:     Head: Normocephalic and atraumatic.  Eyes:     Extraocular Movements: Extraocular movements intact.     Conjunctiva/sclera: Conjunctivae normal.  Pulmonary:     Effort: Pulmonary effort is normal.  Feet:     Comments: Tenderness to palpation throughout right second toe.  Minimal swelling, if any, noticed to distal end of toe.  No discoloration or purulent drainage noted.  Toenail appears intact and normal.  Capillary refill and pulses intact.  Limited range of motion of toe due to pain. Neurological:     General: No focal deficit present.     Mental Status: She is alert and oriented to person, place, and time. Mental status is at baseline.  Psychiatric:        Mood and Affect: Mood normal.        Behavior: Behavior normal.        Thought Content: Thought content normal.        Judgment: Judgment normal.      UC Treatments / Results  Labs (all labs ordered are listed, but only abnormal results are displayed) Labs Reviewed  SARS CORONAVIRUS 2 (TAT 6-24 HRS)    EKG   Radiology DG Toe 2nd Right  Result Date: 03/22/2023 CLINICAL DATA:  Right second toe pain EXAM: RIGHT SECOND TOE COMPARISON:  None Available. FINDINGS: There is no evidence of fracture or dislocation. There is no evidence of arthropathy or other focal bone abnormality. Soft tissues are unremarkable. IMPRESSION: Negative. Electronically Signed   By: Judie Petit.  Shick M.D.   On: 03/22/2023 14:19    Procedures Procedures (including critical care time)  Medications Ordered in UC Medications - No data to display  Initial Impression / Assessment and Plan / UC Course  I have reviewed the triage vital signs and the nursing notes.  Pertinent labs & imaging results that were available during my care of the patient were reviewed by me and considered in my medical  decision making (see chart for  details).     1.  Toe pain  Unsure exact etiology of patient's toe pain.  No signs of ingrown toenail or paronychia on exam.  No signs of cellulitis or fungal infection.  X-ray was completed that was negative for any acute bony abnormality.  Possible muscular strain versus friction from close toed shoes.  Recommended safe over-the-counter pain relievers, elevation, supportive care.  Recommend following up with podiatry at provided contact information for further evaluation and management.  2.  COVID testing  Discussed with patient given she is asymptomatic, do not recommend COVID testing.  Patient would like to proceed with COVID testing so will send COVID PCR.  Advised strict follow-up precautions.  Patient verbalized understanding and was agreeable with plan. Final Clinical Impressions(s) / UC Diagnoses   Final diagnoses:  Pain of toe of right foot  Close exposure to COVID-19 virus     Discharge Instructions      X-ray was normal.  Recommend elevation and ice application.  Follow-up with foot doctor.     ED Prescriptions   None    PDMP not reviewed this encounter.   Gustavus Bryant, [place] 03/22/23 [phone]

## 2023-03-23 ENCOUNTER — Encounter: Payer: Self-pay | Admitting: Podiatry

## 2023-03-23 ENCOUNTER — Ambulatory Visit (INDEPENDENT_AMBULATORY_CARE_PROVIDER_SITE_OTHER): Payer: BLUE CROSS/BLUE SHIELD | Admitting: Podiatry

## 2023-03-23 DIAGNOSIS — S93519A Sprain of interphalangeal joint of unspecified toe(s), initial encounter: Secondary | ICD-10-CM

## 2023-03-23 DIAGNOSIS — M7751 Other enthesopathy of right foot: Secondary | ICD-10-CM

## 2023-03-23 LAB — SARS CORONAVIRUS 2 (TAT 6-24 HRS): SARS Coronavirus 2: NEGATIVE

## 2023-03-23 MED ORDER — METHYLPREDNISOLONE 4 MG PO TBPK
ORAL_TABLET | ORAL | 0 refills | Status: DC
Start: 2023-03-23 — End: 2023-03-23

## 2023-03-23 MED ORDER — METHYLPREDNISOLONE 4 MG PO TBPK
ORAL_TABLET | ORAL | 0 refills | Status: DC
Start: 2023-03-23 — End: 2023-05-12

## 2023-03-23 NOTE — Patient Instructions (Signed)
Rest, ice, elevate the right foot whenever possible at rest Use good supportive stiff soled shoes - Coban dispensed to buddy splint the second and third toes together, apply under mild to moderate compression. - Use compressive ankle sleeve as needed for foot ankle swelling - If still having symptoms in 1 month, follow-up in our office.

## 2023-03-23 NOTE — Progress Notes (Signed)
Chief Complaint  Patient presents with   Foot Pain    Swelling 2nd right toe. Pain to the touch.  On feet a lot for work - picks up boxes.  Seen at urgent care last night, xray - no findings    HPI: 27 y.o. female presents today with pain and swelling to the right second toe.  She denies any contributory past medical history.  She believes this has been going on for roughly a month.  She thinks she may have stubbed the toe at some point.  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  She did go to urgent care for this for x-rays were obtained, negative for fracture.  Denies any nausea, vomiting, fever, chills, chest pain, shortness of breath.  Past Medical History:  Diagnosis Date   Asthma    Heart murmur    Hypertension    Panic attacks     Past Surgical History:  Procedure Laterality Date   CESAREAN SECTION N/A 07/10/2021   Procedure: CESAREAN SECTION;  Surgeon: Milas Hock, MD;  Location: [facility];  Service: Obstetrics;  Laterality: N/A;    Allergies  Allergen Reactions   Cocos Nucifera Swelling    Coconut   Ibuprofen Other (See Comments)    Upsets stomach   Mushroom Extract Complex Other (See Comments)    "her body mood just changes"    ROS negative except as stated in HPI   Physical Exam: There were no vitals filed for this visit.  General: The patient is alert and oriented x3 in no acute distress.  Dermatology: Skin is warm, dry and supple bilateral lower extremities. Interspaces are clear of maceration and debris.  No ecchymosis to the right second digit.  No drainage to the right second digit.  No open wounds noted.  Vascular: Palpable pedal pulses bilaterally. Capillary refill within normal limits.  No appreciable diffuse edema.  No erythema or calor.  Neurological: Light touch sensation grossly intact bilateral feet.   Musculoskeletal Exam: No gross orthopedic deformities noted.  Strength 5/5 in dorsiflexion,  plantarflexion, inversion, eversion for major pedal muscle groups.  There is localized edema of the right second toe that is here on palpation about the fifth met head and joints.  Pain with passive and active range of motion of the toe.  No pain at the metatarsophalangeal joint level.  Radiographic Exam: 03/22/2023 right foot Outside images reviewed.  Normal bone mineralization noted.  No changes to the joint spaces.  No acute osseous fractures or acute osseous abnormalities.  Alignment of the second toe is maintained at the level of the interphalangeal joint and metatarsophalangeal joint.  Assessment/Plan of Care: 1. Capsulitis of toe of right foot   2. Sprain of interphalangeal joint of toe, initial encounter      Meds ordered this encounter  Medications   DISCONTD: methylPREDNISolone (MEDROL DOSEPAK) 4 MG TBPK tablet    Sig: 6 Day Tapering Dose    Dispense:  21 tablet    Refill:  0   methylPREDNISolone (MEDROL DOSEPAK) 4 MG TBPK tablet    Sig: 6 Day Tapering Dose    Dispense:  21 tablet    Refill:  0   None  Discussed clinical findings with patient today.  Plan: - Patient likely experiencing capsulitis of the second toe, likely sustained a sprain - Right second toe was buddy splinted using Coban to the adjacent third toe.  Cap refill was intact  after this  -Dispensed Coban for the patient to continue buddy splinting at home daily - Advised RICE therapy whenever possible to rest the right lower extremity, apply ice and elevate when at rest - 6-day course of Medrol Dosepak prescribed to the patient for pain and inflammation.  Patient states she cannot tolerate NSAIDs and have these caused her to have nausea and vomiting - Follow-up in 1 month if symptoms do not resolve.   Gargi Berch L. Marchia Bond, AACFAS [facility]     [address] [place], [place] [address]                Office [phone]  Fax [phone]

## 2023-03-28 ENCOUNTER — Encounter: Payer: Self-pay | Admitting: Obstetrics and Gynecology

## 2023-03-31 ENCOUNTER — Telehealth: Payer: Self-pay | Admitting: Neurology

## 2023-03-31 ENCOUNTER — Encounter: Payer: Self-pay | Admitting: Neurology

## 2023-03-31 ENCOUNTER — Ambulatory Visit (INDEPENDENT_AMBULATORY_CARE_PROVIDER_SITE_OTHER): Payer: BLUE CROSS/BLUE SHIELD | Admitting: Neurology

## 2023-03-31 VITALS — BP 132/83 | HR 83 | Ht 67.0 in | Wt 208.0 lb

## 2023-03-31 DIAGNOSIS — E66811 Obesity, class 1: Secondary | ICD-10-CM

## 2023-03-31 DIAGNOSIS — R0683 Snoring: Secondary | ICD-10-CM

## 2023-03-31 DIAGNOSIS — R5383 Other fatigue: Secondary | ICD-10-CM

## 2023-03-31 DIAGNOSIS — G4719 Other hypersomnia: Secondary | ICD-10-CM | POA: Diagnosis not present

## 2023-03-31 DIAGNOSIS — Z6831 Body mass index (BMI) 31.0-31.9, adult: Secondary | ICD-10-CM

## 2023-03-31 DIAGNOSIS — G43711 Chronic migraine without aura, intractable, with status migrainosus: Secondary | ICD-10-CM

## 2023-03-31 DIAGNOSIS — R519 Headache, unspecified: Secondary | ICD-10-CM

## 2023-03-31 MED ORDER — NURTEC 75 MG PO TBDP
75.0000 mg | ORAL_TABLET | Freq: Every day | ORAL | Status: DC | PRN
Start: 2023-03-31 — End: 2023-04-14

## 2023-03-31 MED ORDER — EMGALITY 120 MG/ML ~~LOC~~ SOSY: 120.0000 mg | PREFILLED_SYRINGE | SUBCUTANEOUS | 5 refills | Status: DC

## 2023-03-31 MED ORDER — EMGALITY 120 MG/ML ~~LOC~~ SOAJ
240.0000 mg | SUBCUTANEOUS | Status: DC
Start: 2023-03-31 — End: 2023-05-12

## 2023-03-31 NOTE — Patient Instructions (Addendum)
Sleep doctor referral [facility] Address: [address] [place], [place] [address] Hours:  Open ? Closes 5:30?PM Phone: [phone]  Chronic migraines: start prevention Emgality monthly  Blood work  Nurtec take 1 daily for 8 days      Sleep Apnea  Sleep apnea is a condition that affects your breathing while you are sleeping. Your tongue or soft tissue in your throat may block the flow of air while you sleep. You may have shallow breathing or stop breathing for short periods of time. People with sleep apnea may snore loudly. There are three kinds of sleep apnea: Obstructive sleep apnea. This kind is caused by a blocked or collapsed airway. This is the most common. Central sleep apnea. This kind happens when the part of the brain that controls breathing does not send the correct signals to the muscles that control breathing. Mixed sleep apnea. This is a combination of obstructive and central sleep apnea. What are the causes? The most common cause of sleep apnea is a collapsed or blocked airway. What increases the risk? Being very overweight. Having family members with sleep apnea. Having a tongue or tonsils that are larger than normal. Having a small airway or jaw problems. Being older. What are the signs or symptoms? Loud snoring. Restless sleep. Trouble staying asleep. Being sleepy or tired during the day. Waking up gasping or choking. Having a headache in the morning. Mood swings. Having a hard time remembering things and concentrating. How is this diagnosed? A medical history. A physical exam. A sleep study. This is also called a polysomnography test. This test is done at a sleep lab or in your home while you are sleeping. How is this treated? Treatment may include: Sleeping on your side. Losing weight if you're overweight. Wearing an oral appliance. This is a mouthpiece that moves your lower jaw forward. Using a positive airway pressure (PAP)  device to keep your airways open while you sleep, such as: A continuous positive airway pressure (CPAP) device. This device gives forced air through a mask when you breathe out. This keeps your airways open. A bilevel positive airway pressure (BIPAP) device. This device gives forced air through a mask when you breathe in and when you breathe out to keep your airways open. Having surgery if other treatments do not work. If your sleep apnea is not treated, you may be at risk for: Heart failure. Heart attack. Stroke. Type 2 diabetes or a problem with your blood sugar called insulin resistance. Follow these instructions at home: Medicines Take your medicines only as told by your health care provider. Avoid alcohol, medicines to help you relax, and certain pain medicines. These may make sleep apnea worse. General instructions Do not smoke, vape, or use products with nicotine or tobacco in them. If you need help quitting, talk with your provider. If you were given a PAP device to open your airway while you sleep, use it as told by your provider. If you're having surgery, make sure to tell your provider you have sleep apnea. You may need to bring your PAP device with you. Contact a health care provider if: The PAP device that you were given to use during sleep bothers you or does not seem to be working. You do not feel better or you feel worse. Get help right away if: You have trouble breathing. You have chest pain. You have trouble talking. One side of your body feels weak. A part of your face is  hanging down. These symptoms may be an emergency. Call 911 right away. Do not wait to see if the symptoms will go away. Do not drive yourself to the hospital. This information is not intended to replace advice given to you by your health care provider. Make sure you discuss any questions you have with your health care provider. Document Revised: 06/09/2022 Document Reviewed: 06/09/2022 Elsevier  Patient Education  2024 Elsevier Inc.   [place] Injection What is this medication? GALCANEZUMAB (gal ka NEZ ue mab) prevents migraines. It works by blocking a substance in the body that causes migraines. It may also be used to treat cluster headaches. It is a monoclonal antibody. This medicine may be used for other purposes; ask your health care provider or pharmacist if you have questions. COMMON BRAND NAME(S): Emgality What should I tell my care team before I take this medication? They need to know if you have any of these conditions: An unusual or allergic reaction to galcanezumab, other medications, foods, dyes, or preservatives Pregnant or trying to get pregnant Breast-feeding How should I use this medication? This medication is injected under the skin. You will be taught how to prepare and give it. Take it as directed on the prescription label. Keep taking it unless your care team tells you to stop. It is important that you put your used needles and syringes in a special sharps container. Do not put them in a trash can. If you do not have a sharps container, call your pharmacist or care team to get one. Talk to your care team about the use of this medication in children. Special care may be needed. Overdosage: If you think you have taken too much of this medicine contact a poison control center or emergency room at once. NOTE: This medicine is only for you. Do not share this medicine with others. What if I miss a dose? If you miss a dose, take it as soon as you can. If it is almost time for your next dose, take only that dose. Do not take double or extra doses. What may interact with this medication? Interactions are not expected. This list may not describe all possible interactions. Give your health care provider a list of all the medicines, herbs, non-prescription drugs, or dietary supplements you use. Also tell them if you smoke, drink alcohol, or use illegal drugs. Some items  may interact with your medicine. What should I watch for while using this medication? Visit your care team for regular checks on your progress. Tell your care team if your symptoms do not start to get better or if they get worse. What side effects may I notice from receiving this medication? Side effects that you should report to your care team as soon as possible: Allergic reactions or angioedema--skin rash, itching or hives, swelling of the face, eyes, lips, tongue, arms, or legs, trouble swallowing or breathing Side effects that usually do not require medical attention (report to your care team if they continue or are bothersome): Pain, redness, or irritation at injection site This list may not describe all possible side effects. Call your doctor for medical advice about side effects. You may report side effects to FDA at 1-800-FDA-1088. Where should I keep my medication? Keep out of the reach of children and pets. Store in a refrigerator or at room temperature between 20 and 25 degrees C (68 and 77 degrees F). Refrigeration (preferred): Store in the refrigerator. Do not freeze. Keep in the original container until you  are ready to take it. Remove the dose from the carton about 30 minutes before it is time for you to use it. If the dose is not used, it may be stored in original container at room temperature for 7 days. Get rid of any unused medication after the expiration date. Room Temperature: This medication may be stored at room temperature for up to 7 days. Keep it in the original container. Protect from light until time of use. If it is stored at room temperature, get rid of any unused medication after 7 days or after it expires, whichever is first. To get rid of medications that are no longer needed or have expired: Take the medication to a medication take-back program. Check with your pharmacy or law enforcement to find a location. If you cannot return the medication, ask your pharmacist  or care team how to get rid of this medication safely. NOTE: This sheet is a summary. It may not cover all possible information. If you have questions about this medicine, talk to your doctor, pharmacist, or health care provider.  2024 Elsevier/Gold Standard (2021-05-31 00:00:00)

## 2023-03-31 NOTE — Progress Notes (Unsigned)
[facility]    Provider:  Dr Lucia Gaskins Requesting Provider: Michelle Piper, PA* Primary Care Provider:  [facility]  CC:  headache, fatigue  HPI:  Mackenzie Schultz is a 27 y.o. female here as requested by Michelle Piper, PA* for headaches. has Supervision of high risk pregnancy, antepartum; Maternal obesity affecting pregnancy, antepartum; Heart murmur; Chronic hypertension in obstetric context in second trimester; Cesarean delivery delivered; and Acute blood loss as cause of postoperative anemia on their problem list.  Has had headaches all her life. Getting worse, cries herself to sleep, pulsating/pounding/throbbing, in the left side behind the eye. Phonophobia/photophobia, nausea, smells bother her, sleep helps, a dark room helps, she does not vomit, she snores, wakes up with headache, wakes up with dry mouth, excesively tired, large neck, daily headaches. Affecting quality of life. At least 15 migraine days a month that can last 8 to 24 hours to be moderate to severe, daily headaches, ongoing for greater than a year. Can't care for child.   Reviewed notes, labs and imaging from outside physicians, which showed:  CLINICAL DATA:  Recurrent headaches, worsening   EXAM: CT HEAD WITHOUT CONTRAST   TECHNIQUE: Contiguous axial images were obtained from the base of the skull through the vertex without intravenous contrast.   RADIATION DOSE REDUCTION: This exam was performed according to the departmental dose-optimization program which includes automated exposure control, adjustment of the mA and/or kV according to patient size and/or use of iterative reconstruction technique.   COMPARISON:  01/23/2020   FINDINGS: Brain: No evidence of acute infarction, hemorrhage, mass, mass effect, or midline shift. No hydrocephalus or extra-axial fluid collection.   Vascular: No hyperdense vessel.   Skull: Negative for fracture or focal  lesion.   Sinuses/Orbits: Clear paranasal sinuses.  Normal orbits.   Other: The mastoid air cells are well aerated.   IMPRESSION: No acute intracranial process. No etiology is seen for the patient's headaches.  Review of Systems: Patient complains of symptoms per HPI as well as the following symptoms fatigue. Pertinent negatives and positives per HPI. All others negative.   Social History   Socioeconomic History   Marital status: Single    Spouse name: Not on file   Number of children: Not on file   Years of education: Not on file   Highest education level: Not on file  Occupational History   Not on file  Tobacco Use   Smoking status: Former    Types: Cigars   Smokeless tobacco: Never  Vaping Use   Vaping status: Never Used  Substance and Sexual Activity   Alcohol use: Not Currently    Comment: Occasional, not since confirmed pregnancy   Drug use: No   Sexual activity: Yes    Partners: Male    Birth control/protection: None  Other Topics Concern   Not on file  Social History Narrative   Caffeine: none   Left  handed   Social Drivers of Corporate investment banker Strain: Not on file  Food Insecurity: Not on file  Transportation Needs: Not on file  Physical Activity: Not on file  Stress: Not on file  Social Connections: Not on file  Intimate Partner Violence: Not on file    Family History  Problem Relation Age of Onset   Hypertension Mother    Hypertension Father    Hypertension Other    Migraines Neg Hx     Past Medical History:  Diagnosis Date   Asthma  Heart murmur    Hypertension    Panic attacks     Patient Active Problem List   Diagnosis Date Noted   Acute blood loss as cause of postoperative anemia 07/11/2021   Cesarean delivery delivered 07/10/2021   Heart murmur 02/08/2021   Chronic hypertension in obstetric context in second trimester 02/08/2021   Maternal obesity affecting pregnancy, antepartum 01/11/2021   Supervision of high  risk pregnancy, antepartum 12/23/2020    Past Surgical History:  Procedure Laterality Date   CESAREAN SECTION N/A 07/10/2021   Procedure: CESAREAN SECTION;  Surgeon: Milas Hock, MD;  Location: [facility] LD [facility]S;  Service: Obstetrics;  Laterality: N/A;    Current Outpatient Medications  Medication Sig Dispense Refill   acetaminophen (TYLENOL) 500 MG tablet Take 2 tablets (1,000 mg total) by mouth every 6 (six) hours. (Patient taking differently: Take 1,000 mg by mouth every 6 (six) hours. As needed) 30 tablet 0   Blood Pressure Monitoring (BLOOD PRESSURE KIT) DEVI Use as directed once a week 1 each 0   Galcanezumab-gnlm (EMGALITY) 120 MG/ML SOAJ Inject 240 mg into the skin every 30 (thirty) days.     Ipratropium-Albuterol (COMBIVENT) 20-100 MCG/ACT AERS respimat Inhale 1 puff into the lungs every 6 (six) hours as needed for wheezing or shortness of breath. 1 each 3   methylPREDNISolone (MEDROL DOSEPAK) 4 MG TBPK tablet 6 Day Tapering Dose 21 tablet 0   NIFEdipine (ADALAT CC) 30 MG 24 hr tablet Take 1 tablet (30 mg total) by mouth daily. 60 tablet 0   polyethylene glycol powder (GLYCOLAX/MIRALAX) 17 GM/SCOOP powder Take 17 g by mouth daily as needed for mild constipation. 238 g 0   Rimegepant Sulfate (NURTEC) 75 MG TBDP Take 1 tablet (75 mg total) by mouth daily as needed. Take daily for 8 days.     Galcanezumab-gnlm (EMGALITY) 120 MG/ML SOSY Inject 120 mg into the skin every 30 (thirty) days. PLEASE USE COPAY CARD: BIN [id number] PCN PDMI GROUP [id number] ID [id number] expires: 04/18/2023, 1 mL 5   lidocaine (LIDODERM) 5 % Place 1 patch onto the skin daily. Remove & Discard patch within 12 hours (Patient not taking: Reported on 03/31/2023) 14 patch 0   No current facility-administered medications for this visit.    Allergies as of 03/31/2023 - Review Complete 03/31/2023  Allergen Reaction Noted   Cocos nucifera Swelling 07/07/2021   Ibuprofen Other (See Comments) 06/04/2018   Mushroom extract  complex (do not select) Other (See Comments) 06/04/2018    Vitals: BP 132/83 (BP Location: Right Arm, Patient Position: Sitting, Cuff Size: Large)   Pulse 83   Ht 5\' 7"  (1.702 m)   Wt 208 lb (94.3 kg)   LMP 03/08/2023 (Approximate)   Breastfeeding No   BMI 32.58 kg/m  Last Weight:  Wt Readings from Last 1 Encounters:  03/31/23 208 lb (94.3 kg)   Last Height:   Ht Readings from Last 1 Encounters:  03/31/23 5\' 7"  (1.702 m)     Physical exam: Exam: Gen: NAD, conversant, well nourised, obese, well groomed                     CV: RRR, no MRG. No Carotid Bruits. No peripheral edema, warm, nontender Eyes: Conjunctivae clear without exudates or hemorrhage  Neuro: Detailed Neurologic Exam  Speech:    Speech is normal; fluent and spontaneous with normal comprehension.  Cognition:    The patient is oriented to person, place, and time;     recent and  remote memory intact;     language fluent;     normal attention, concentration,     fund of knowledge Cranial Nerves:    The pupils are equal, round, and reactive to light. The fundi are normal and spontaneous venous pulsations are present. Visual fields are full to finger confrontation. Extraocular movements are intact. Trigeminal sensation is intact and the muscles of mastication are normal. The face is symmetric. The palate elevates in the midline. Hearing intact. Voice is normal. Shoulder shrug is normal. The tongue has normal motion without fasciculations.   Coordination:    Normal finger to nose and heel to shin. Normal rapid alternating movements.   Gait:    Heel-toe and tandem gait are normal.   Motor Observation:    No asymmetry, no atrophy, and no involuntary movements noted. Tone:    Normal muscle tone.    Posture:    Posture is normal. normal erect    Strength:    Strength is V/V in the upper and lower limbs.      Sensation: intact to LT     Reflex Exam:  DTR's:    Deep tendon reflexes in the upper and  lower extremities are normal bilaterally.   Toes:    The toes are downgoing bilaterally.   Clonus:    Clonus is absent.    Assessment/Plan: Patient with chronic migraines for greater than 1 year.  We discussed all options, extended visit, here with girlfriend who provides much information as well, we discussed migraines, migraine management, acute and preventative, educated her on migraine lifestyle and health and risk factors.  Signed her up for emgality card: PLEASE USE COPAY CARD: BIN [id number] PCN PDMI GROUP [id number] ID [id number] expires: 04/18/2023,   Sleep doctor referral : she snores, wakes up with headache, wakes up with dry mouth, excesively tired, large neck, daily headaches. ESS . 17, obese  For primary care if needed for family as requested can try your insurance providers or other pcps in the area such as ry [facility] Address: [address], [place], [place] [address] Hours:  Open ? Closes 5:30?PM Phone: [phone] Tried to call but was placed on line for 30 minutes  Chronic migraines: start prevention Emgality monthly  Blood work  Nurtec take 1 daily for 8 days to "bridge"  To prevent or relieve headaches, try the following: Cool Compress. Lie down and place a cool compress on your head.  Avoid headache triggers. If certain foods or odors seem to have triggered your migraines in the past, avoid them. A headache diary might help you identify triggers.  Include physical activity in your daily routine. Try a daily walk or other moderate aerobic exercise.  Manage stress. Find healthy ways to cope with the stressors, such as delegating tasks on your to-do list.  Practice relaxation techniques. Try deep breathing, yoga, massage and visualization.  Eat regularly. Eating regularly scheduled meals and maintaining a healthy diet might help prevent headaches. Also, drink plenty of fluids.  Follow a regular sleep schedule. Sleep deprivation might contribute to  headaches Consider biofeedback. With this mind-body technique, you learn to control certain bodily functions -- such as muscle tension, heart rate and blood pressure -- to prevent headaches or reduce headache pain.    Proceed to emergency room if you experience new or worsening symptoms or symptoms do not resolve, if you have new neurologic symptoms or if headache is severe, or for any concerning symptom.   Provided education  and documentation from American headache Society toolbox including articles on: chronic migraine medication overuse headache, chronic migraines, prevention of migraines, behavioral and other nonpharmacologic treatments for headache.    Orders Placed This Encounter  Procedures   Hemoglobin A1c   TSH Rfx on Abnormal to Free T4   Ambulatory referral to Sleep Studies   Meds ordered this encounter  Medications   Galcanezumab-gnlm (EMGALITY) 120 MG/ML SOAJ    Sig: Inject 240 mg into the skin every 30 (thirty) days.   Rimegepant Sulfate (NURTEC) 75 MG TBDP    Sig: Take 1 tablet (75 mg total) by mouth daily as needed. Take daily for 8 days.   Galcanezumab-gnlm (EMGALITY) 120 MG/ML SOSY    Sig: Inject 120 mg into the skin every 30 (thirty) days. PLEASE USE COPAY CARD: BIN [id number] PCN PDMI GROUP [id number] ID [id number] expires: 04/18/2023,    Dispense:  1 mL    Refill:  5    PLEASE USE COPAY CARD: BIN [id number] PCN PDMI GROUP [id number] ID [id number] expires: 04/18/2023,    Cc: Schutt, Edsel Petrin, PA*,  [facility]  Naomie Dean, MD  [facility] [address] [place], [place] [address]  Phone [phone] Fax [phone]  I spent over 60 minutes of face-to-face and non-face-to-face time with patient on the  1. Chronic migraine without aura, with intractable migraine, so stated, with status migrainosus   2. Other fatigue   3. Excessive daytime sleepiness   4. Morning headache   5. Snoring   6. Class 1  obesity with body mass index (BMI) of 31.0 to 31.9 in adult, unspecified obesity type, unspecified whether serious comorbidity present    diagnosis.  This included previsit chart review, lab review, study review, order entry, electronic health record documentation, patient education on the different diagnostic and therapeutic options, counseling and coordination of care, risks and benefits of management, compliance, or risk factor reduction

## 2023-03-31 NOTE — Telephone Encounter (Signed)
Patient called after-hours call service to inquire about Emgality prescription.  [facility] [address]

## 2023-04-01 ENCOUNTER — Encounter: Payer: Self-pay | Admitting: Neurology

## 2023-04-01 LAB — HEMOGLOBIN A1C
Est. average glucose Bld gHb Est-mCnc: 120 mg/dL
Hgb A1c MFr Bld: 5.8 % — ABNORMAL HIGH (ref 4.8–5.6)

## 2023-04-01 LAB — TSH RFX ON ABNORMAL TO FREE T4: TSH: 1.24 u[IU]/mL (ref 0.450–4.500)

## 2023-04-01 NOTE — Telephone Encounter (Signed)
I'm sorry I injected her and told her we had a month to get her a prescriptiona nd approved it is not appropriate to text the after call physician for this. I will address it thanks

## 2023-04-02 ENCOUNTER — Encounter: Payer: Self-pay | Admitting: Neurology

## 2023-04-02 MED ORDER — EMGALITY 120 MG/ML ~~LOC~~ SOSY
120.0000 mg | PREFILLED_SYRINGE | SUBCUTANEOUS | 5 refills | Status: DC
Start: 2023-04-02 — End: 2023-05-12

## 2023-04-14 ENCOUNTER — Telehealth: Payer: Self-pay

## 2023-04-14 ENCOUNTER — Encounter: Payer: Self-pay | Admitting: Neurology

## 2023-04-14 ENCOUNTER — Other Ambulatory Visit (HOSPITAL_COMMUNITY): Payer: Self-pay

## 2023-04-14 ENCOUNTER — Telehealth: Payer: Self-pay | Admitting: Neurology

## 2023-04-14 DIAGNOSIS — G43711 Chronic migraine without aura, intractable, with status migrainosus: Secondary | ICD-10-CM

## 2023-04-14 DIAGNOSIS — R5383 Other fatigue: Secondary | ICD-10-CM

## 2023-04-14 MED ORDER — NURTEC 75 MG PO TBDP
75.0000 mg | ORAL_TABLET | Freq: Every day | ORAL | 0 refills | Status: DC | PRN
Start: 2023-04-14 — End: 2023-05-12

## 2023-04-14 NOTE — Telephone Encounter (Signed)
   I could not find record in chart of having tried a Triptan or any contraindications documented. Please advise- Also-    Chart states PT has at least 15 migraine headache days per month-will more than likely get denied. Usually they want them to have less than 15 HA days per month. Please advise

## 2023-04-14 NOTE — Telephone Encounter (Signed)
Pt states she took her last sample of Rimegepant Sulfate (NURTEC) 75 MG TBDPon yesterday, she'd like to know if Dr Lucia Gaskins will [phone]

## 2023-04-14 NOTE — Telephone Encounter (Signed)
   Could not find tried and failed meds-please advise-

## 2023-04-14 NOTE — Telephone Encounter (Signed)
Refill has been sent to the pharmacy and advised a prior authorization will likely be needed. I will route to the pa team

## 2023-04-16 ENCOUNTER — Other Ambulatory Visit: Payer: Self-pay | Admitting: Neurology

## 2023-04-16 DIAGNOSIS — G43711 Chronic migraine without aura, intractable, with status migrainosus: Secondary | ICD-10-CM

## 2023-04-16 DIAGNOSIS — R5383 Other fatigue: Secondary | ICD-10-CM

## 2023-04-17 NOTE — Telephone Encounter (Signed)
Ai called pt and relayed that trying to get her medications approved thru insurance.  They need more information.  I went thru the list and she said the only thing was metoprolol. Will try to get nurtec and emgality approved with insurance   If not then will have to change  to something else.  Samples are samples.  She has taken 8 tabs of nurtec.  Is out now.

## 2023-04-17 NOTE — Telephone Encounter (Signed)
I called pt and she said that the only thing that she has taken is metoprolol.

## 2023-04-17 NOTE — Telephone Encounter (Signed)
Yes, she will have to take Emgality for a few months and see [place] at follow up and then we can see if we can get nurtec or ubrelvy approved thanks

## 2023-04-17 NOTE — Telephone Encounter (Signed)
Not she has not tried any triptans.

## 2023-04-17 NOTE — Telephone Encounter (Signed)
PA submitted-will likely be denied since PT has not tried any preventatives except Metoprolol (Most require trial and failure of two preventatives before they will cover injectables or Qulipta) and have no documentation of tried and failed preventatives. Awaiting determination.

## 2023-04-20 NOTE — Telephone Encounter (Signed)
 Pharmacy Patient Advocate Encounter  Received notification from [facility] Medicaid that Prior Authorization for Emgality  120MG /ML auto-injectors (migraine) has been DENIED.  Full denial letter will be uploaded to the media tab. See denial reason below.   PA #/Case ID/Reference #: Reference Number: [id number]

## 2023-04-20 NOTE — Telephone Encounter (Signed)
 Dr Lucia Gaskins, pt has tried Metoprolol but will still have to try a second oral preventive first. What do you recommend next? See list of preferred options below.

## 2023-04-20 NOTE — Progress Notes (Signed)
  Cardiology Office Note:  .   Date:  04/21/2023  ID:  Mackenzie Schultz, DOB [birth year], MRN [id number] PCP: [facility]  [facility] Providers Cardiologist:  None { History of Present Illness: .   Mackenzie Schultz is a 28 y.o. female with history of HTN who presents for the evaluation of murmur at the request of [facility].   History of Present Illness   Mackenzie Schultz, a 28 year old with a history of hypertension, presents for evaluation of a heart murmur. She reports being told she had a heart murmur as a child, but her primary care provider has not mentioned it recently. She has been experiencing infrequent episodes of chest pain for the past few years, described as sharp and achy, lasting several seconds and resolving without intervention. These episodes occur randomly and do not seem to be triggered by any specific activity. She reports no significant shortness of breath. In addition to hypertension, she has a history of migraines and is currently working with a headache specialist. She has been labeled as prediabetic and is working on dietary modifications. She has no history of heart attack, stroke, or diabetes. She has had one C-section. Her mother had a heart murmur. She does not smoke or drink alcohol regularly, and she has one child. She is currently taking Nefedipine 30mg  daily for hypertension. BP well controlled.          Problem List HTN Migraine     ROS: All other ROS reviewed and negative. Pertinent positives noted in the HPI.     Studies Reviewed: SABRA   EKG Interpretation Date/Time:  Friday April 21 2023 16:25:26 EST Ventricular Rate:  89 PR Interval:  162 QRS Duration:  98 QT Interval:  370 QTC Calculation: 450 R Axis:   -25  Text Interpretation: Normal sinus rhythm Possible Left atrial enlargement Left ventricular hypertrophy ( R in aVL , Romhilt-Estes ) Confirmed by Barbaraann Kotyk [phone]) on 04/21/2023 4:28:17  PM   Physical Exam:   VS:  Pulse 96   Ht 5' 7 (1.702 m)   Wt 212 lb (96.2 kg)   LMP 03/08/2023 (Approximate)   SpO2 98%   BMI 33.20 kg/m    Wt Readings from Last 3 Encounters:  04/21/23 212 lb (96.2 kg)  03/31/23 208 lb (94.3 kg)  12/28/22 150 lb (68 kg)    GEN: Well nourished, well developed in no acute distress NECK: No JVD; No carotid bruits CARDIAC: RRR, 2/6 SEM RESPIRATORY:  Clear to auscultation without rales, wheezing or rhonchi  ABDOMEN: Soft, non-tender, non-distended EXTREMITIES:  No edema; No deformity  ASSESSMENT AND PLAN: .   Assessment and Plan    Heart Murmur History of heart murmur since childhood. Faint murmur noted on examination today. Infrequent episodes of chest pain, likely noncardiac. EKG shows LVH. -Order echocardiogram to assess structural normality of the heart.   Chest pain, non-cardiac -describes infrequent episodes of non-cardiac CP. We will check echo as above. Do not suspect CAD.   Hypertension Well-controlled on Nifedipine  30mg  daily. -Continue current regimen. -echo as above              Follow-up: Return if symptoms worsen or fail to improve.   Signed, Kotyk DASEN. Barbaraann, MD, [facility]  [facility]  [address] [place], [place] [address] [phone]  4:40 PM

## 2023-04-21 ENCOUNTER — Encounter: Payer: Self-pay | Admitting: Cardiovascular Disease

## 2023-04-21 ENCOUNTER — Ambulatory Visit: Payer: Medicaid Other | Attending: Cardiovascular Disease | Admitting: Cardiovascular Disease

## 2023-04-21 VITALS — HR 96 | Ht 67.0 in | Wt 212.0 lb

## 2023-04-21 DIAGNOSIS — I150 Renovascular hypertension: Secondary | ICD-10-CM | POA: Diagnosis not present

## 2023-04-21 DIAGNOSIS — R011 Cardiac murmur, unspecified: Secondary | ICD-10-CM

## 2023-04-21 DIAGNOSIS — R072 Precordial pain: Secondary | ICD-10-CM | POA: Diagnosis not present

## 2023-04-21 NOTE — Patient Instructions (Signed)
 Medication Instructions:  No changes  *If you need a refill on your cardiac medications before your next appointment, please call your pharmacy*   Lab Work: None  If you have labs (blood work) drawn today and your tests are completely normal, you will receive your results only by: MyChart Message (if you have MyChart) OR A paper copy in the mail If you have any lab test that is abnormal or we need to change your treatment, we will call you to review the results.   Testing/Procedures: Echo will be scheduled at [address].  Your physician has requested that you have an echocardiogram. Echocardiography is a painless test that uses sound waves to create images of your heart. It provides your doctor with information about the size and shape of your heart and how well your heart's chambers and valves are working. This procedure takes approximately one hour. There are no restrictions for this procedure. Please do NOT wear cologne, perfume, aftershave, or lotions (deodorant is allowed). Please arrive 15 minutes prior to your appointment time.    Follow-Up: At [facility], you and your health needs are our priority.  As part of our continuing mission to provide you with exceptional heart care, we have created designated Provider Care Teams.  These Care Teams include your primary Cardiologist (physician) and Advanced Practice Providers (APPs -  Physician Assistants and Nurse Practitioners) who all work together to provide you with the care you need, when you need it.  We recommend signing up for the patient portal called MyChart.  Sign up information is provided on this After Visit Summary.  MyChart is used to connect with patients for Virtual Visits (Telemedicine).  Patients are able to view lab/test results, encounter notes, upcoming appointments, etc.  Non-urgent messages can be sent to your provider as well.   To learn more about what you can do with MyChart, go to  [email or web address].    Your next appointment:   As needed  Provider:   Dr. Barbaraann

## 2023-04-24 NOTE — Telephone Encounter (Signed)
 I did not note it but I am pretty sure patient had tried some of these including amitriptyline and topiramate . Would you call and ask her if she has tried:  Amitriptyline, venlafaxine/effexor or any SSRI, nortriptyline, Depakote, topiramate /Topamax , lisinopril or candesartan please.  That I can update my note.  I also is pretty sure she had tried Imitrex and Maxalt in the past.  If she had not tried any of these I would let her know that I would recommend we try topiramate  25 mg at bedtime because insurance requires us  to try at least 1 other medication before Emgality  is approved.  Please let her know she cannot get pregnant with topiramate .  We can start at 25 mg and increase to 50 mg at bedtime and this is a very common migraine medications. Common side effects of Topamax  that may include:  tiredness, drowsiness, dizziness,  numbness or tingly feeling, , weight loss,  loss of appetite, changes in taste but if she agrees we can mychart her the complete topamax  patient instructions. If she does not do well on topiramate  or not effective we can increase or try Emgality  again thanks

## 2023-04-24 NOTE — Telephone Encounter (Signed)
 I called the patient and discussed the message below by Dr Ines. The patient states she has never tried/never heard of any of those medications list below (Amitriptyline, venlafaxine/effexor or any SSRI, nortriptyline, Depakote, topiramate /Topamax , lisinopril or candesartan, imitrex, maxalt). She is willing to try topiramate .  We discussed the potential side effects as noted below by Dr. Ines.  I let her know that she cannot get pregnant with topiramate . Will let Dr Ines know then [facility]. Pt's questions were answered and she verbalized appreciation for the call.

## 2023-04-24 NOTE — Addendum Note (Signed)
 Addended by: Bertram Savin on: 04/24/2023 05:28 PM   Modules accepted: Orders

## 2023-04-25 ENCOUNTER — Encounter: Payer: Self-pay | Admitting: *Deleted

## 2023-04-25 ENCOUNTER — Encounter: Payer: Self-pay | Admitting: Cardiovascular Disease

## 2023-04-25 ENCOUNTER — Ambulatory Visit (HOSPITAL_COMMUNITY): Payer: BLUE CROSS/BLUE SHIELD | Attending: Cardiology

## 2023-04-25 DIAGNOSIS — R011 Cardiac murmur, unspecified: Secondary | ICD-10-CM | POA: Diagnosis present

## 2023-04-25 LAB — ECHOCARDIOGRAM COMPLETE
Area-P 1/2: 3.75 cm2
S' Lateral: 2.8 cm

## 2023-04-25 MED ORDER — TOPIRAMATE 25 MG PO TABS
ORAL_TABLET | ORAL | 6 refills | Status: DC
Start: 2023-04-25 — End: 2023-07-19

## 2023-04-25 NOTE — Telephone Encounter (Signed)
 Start with one pill(25mg ) at bedtime and in 1 week increase to 2 pills(50mg ) at bedtime

## 2023-04-25 NOTE — Addendum Note (Signed)
 Addended by: Naomie Dean B on: 04/25/2023 04:00 PM   Modules accepted: Orders

## 2023-05-10 ENCOUNTER — Ambulatory Visit: Payer: Medicaid Other | Admitting: Neurology

## 2023-05-10 ENCOUNTER — Encounter: Payer: Self-pay | Admitting: Neurology

## 2023-05-10 VITALS — BP 138/87 | HR 92 | Ht 67.0 in | Wt 210.0 lb

## 2023-05-10 DIAGNOSIS — G4719 Other hypersomnia: Secondary | ICD-10-CM

## 2023-05-10 DIAGNOSIS — G43701 Chronic migraine without aura, not intractable, with status migrainosus: Secondary | ICD-10-CM

## 2023-05-10 DIAGNOSIS — Z72821 Inadequate sleep hygiene: Secondary | ICD-10-CM

## 2023-05-10 DIAGNOSIS — Z72820 Sleep deprivation: Secondary | ICD-10-CM | POA: Insufficient documentation

## 2023-05-10 NOTE — Patient Instructions (Signed)
Quality Sleep Information, Adult Quality sleep is important for your mental and physical health. It also improves your quality of life. Quality sleep means you: Are asleep for most of the time you are in bed. Fall asleep within 30 minutes. Wake up no more than once a night. Are awake for no longer than 20 minutes if you do wake up during the night. Most adults need 7-8 hours of quality sleep each night. How can poor sleep affect me? If you do not get enough quality sleep, you may have: Mood swings. Daytime sleepiness. Decreased alertness, reaction time, and concentration. Sleep disorders, such as insomnia and sleep apnea. Difficulty with: Solving problems. Coping with stress. Paying attention. These issues may affect your performance and productivity at work, school, and home. Lack of sleep may also put you at higher risk for accidents, suicide, and risky behaviors. If you do not get quality sleep, you may also be at higher risk for several health problems, including: Infections. Type 2 diabetes. Heart disease. High blood pressure. Obesity. Worsening of long-term conditions, like arthritis, kidney disease, depression, Parkinson's disease, and epilepsy. What actions can I take to get more quality sleep? Sleep schedule and routine Stick to a sleep schedule. Go to sleep and wake up at about the same time each day. Do not try to sleep less on weekdays and make up for lost sleep on weekends. This does not work. Limit naps during the day to 30 minutes or less. Do not take naps in the late afternoon. Make time to relax before bed. Reading, listening to music, or taking a hot bath promotes quality sleep. Make your bedroom a place that promotes quality sleep. Keep your bedroom dark, quiet, and at a comfortable room temperature. Make sure your bed is comfortable. Avoid using electronic devices that give off bright blue light for 30 minutes before bedtime. Your brain perceives bright blue light  as sunlight. This includes television, phones, and computers. If you are lying awake in bed for longer than 20 minutes, get up and do a relaxing activity until you feel sleepy. Lifestyle     Try to get at least 30 minutes of exercise on most days. Do not exercise 2-3 hours before going to bed. Do not use any products that contain nicotine or tobacco. These products include cigarettes, chewing tobacco, and vaping devices, such as e-cigarettes. If you need help quitting, ask your health care provider. Do not drink caffeinated beverages for at least 8 hours before going to bed. Coffee, tea, and some sodas contain caffeine. Do not drink alcohol or eat large meals close to bedtime. Try to get at least 30 minutes of sunlight every day. Morning sunlight is best. Medical concerns Work with your health care provider to treat medical conditions that may affect sleeping, such as: Nasal obstruction. Snoring. Sleep apnea and other sleep disorders. Talk to your health care provider if you think any of your prescription medicines may cause you to have difficulty falling or staying asleep. If you have sleep problems, talk with a sleep consultant. If you think you have a sleep disorder, talk with your health care provider about getting evaluated by a specialist. Where to find more information Sleep Foundation: sleepfoundation.org American Academy of Sleep Medicine: aasm.org Centers for Disease Control and Prevention (CDC): TonerPromos.no Contact a health care provider if: You have trouble getting to sleep or staying asleep. You often wake up very early in the morning and cannot get back to sleep. You have daytime sleepiness. You  have daytime sleep attacks of suddenly falling asleep and sudden muscle weakness (narcolepsy). You have a tingling sensation in your legs with a strong urge to move your legs (restless legs syndrome). You stop breathing briefly during sleep (sleep apnea). You think you have a sleep  disorder or are taking a medicine that is affecting your quality of sleep. Summary Most adults need 7-8 hours of quality sleep each night. Getting enough quality sleep is important for your mental and physical health. Make your bedroom a place that promotes quality sleep, and avoid things that may cause you to have poor sleep, such as alcohol, caffeine, smoking, or large meals. Talk to your health care provider if you have trouble falling asleep or staying asleep. This information is not intended to replace advice given to you by your health care provider. Make sure you discuss any questions you have with your health care provider. Document Revised: 07/28/2021 Document Reviewed: 07/28/2021 Elsevier Patient Education  2024 Elsevier Inc.Healthy Living: Sleep In this video, you will learn why sleep is an important part of a healthy lifestyle. To view the content, go to this web address: [email or web address].elsevier.[email or web address]  This video will expire on: 03/29/2025. If you need access to this video following this date, please reach out to the healthcare provider who assigned it to you. This information is not intended to replace advice given to you by your health care provider. Make sure you discuss any questions you have with your health care provider. Elsevier Patient Education  2024 [employer].

## 2023-05-10 NOTE — Progress Notes (Signed)
SLEEP MEDICINE CLINIC    Provider:  Melvyn Novas, MD  Primary Care Physician:  [facility] [address] [place] [address]     Referring Provider: Anson Fret, Md [address] [place],  [place] [address]          Chief Complaint according to patient   Patient presents with:     New Patient (Initial Visit)           HISTORY OF PRESENT ILLNESS:  Mackenzie Schultz is a 28 y.o. female patient who is seen upon referral by dr Lucia Gaskins, MD on 05/10/2023  for a sleep consultation.  Chief concern according to patient :  I had headaches throughout the day, went to bed with HA woke up with headaches.  With the new medication( Topiramate ) and this reduced fro 9 out of 10 intensity, went to the ED often in the past, not now. I am on Emgality and Nurtec samples.    I have the pleasure of seeing Mackenzie Schultz 05/10/23 a right -handed female with a possible sleep disorder.     Sleep relevant medical history:  obesity, Nocturia once , no ENT ,Tonsillectomy, no braces.    Family medical /sleep history: No  other family member dx  with OSA,   Social history:  Patient is not gainfully employed, has a 43 year old son.  She used to be working in night shifts, warehousing, and lives in a household with mother and son.  No pets.  Tobacco use: none - quit years ago- .   ETOH use ; 2 week,  Caffeine intake in form of Coffee( /) Soda( /) Tea ( hot ) or energy drinks Exercise in form of walking, outdoors.         Sleep habits are as follows: The patient's dinner time is between 6-7 PM. The patient goes to bed at 5 AM,  and continues to sleep for 1-2  hours, the baby wakes her and sleeps in her bed.  He is restless  The preferred sleep position is variable , with the support of 2 pillows.  Dreams are reportedly rare.  The patient wakes up through the toddler  , 6 AM is the usual rise time. She reports not feeling refreshed or restored  in AM, with symptoms such as dry mouth, morning headaches, and residual fatigue.  Some times she has been woken by temporal headaches , sometimes  with nausea. Naps are taken frequently, lasting from 5 to 15 minutes.    Review of Systems: Out of a complete 14 system review, the patient complains of only the following symptoms, and all other reviewed systems are negative.:  Fatigue, sleepiness , snoring.    How likely are you to doze in the following situations: 0 = not likely, 1 = slight chance, 2 = moderate chance, 3 = high chance   Sitting and Reading? Watching Television? Sitting inactive in a public place (theater or meeting)? As a passenger in a car for an hour without a break? Lying down in the afternoon when circumstances permit? Sitting and talking to someone? Sitting quietly after lunch without alcohol? In a car, while stopped for a few minutes in traffic?   Total = 13/ 24 points   FSS endorsed at 9/ 63 points.   Social History   Socioeconomic History   Marital status: Single    Spouse name: Not on file   Number  of children: 1   Years of education: Not on file   Highest education level: Not on file  Occupational History   Occupation: Unemployed  Tobacco Use   Smoking status: Former    Types: Cigars   Smokeless tobacco: Never  Vaping Use   Vaping status: Never Used  Substance and Sexual Activity   Alcohol use: Not Currently    Comment: Occasional, not since confirmed pregnancy   Drug use: No   Sexual activity: Yes    Partners: Male    Birth control/protection: None  Other Topics Concern   Not on file  Social History Narrative   Caffeine: none   Left  handed   Social Drivers of Health   Financial Resource Strain: Not on file  Food Insecurity: Not on file  Transportation Needs: Not on file  Physical Activity: Not on file  Stress: Not on file  Social Connections: Not on file    Family History  Problem Relation Age of Onset   Heart disease Mother     Hypertension Mother    Hypertension Father    Hypertension Other    Migraines Neg Hx     Past Medical History:  Diagnosis Date   Asthma    Heart murmur    Hypertension    Panic attacks     Past Surgical History:  Procedure Laterality Date   CESAREAN SECTION N/A 07/10/2021   Procedure: CESAREAN SECTION;  Surgeon: Milas Hock, MD;  Location: [facility] LD ORS;  Service: Obstetrics;  Laterality: N/A;     Current Outpatient Medications on File Prior to Visit  Medication Sig Dispense Refill   acetaminophen (TYLENOL) 500 MG tablet Take 2 tablets (1,000 mg total) by mouth every 6 (six) hours. (Patient taking differently: Take 1,000 mg by mouth every 6 (six) hours. As needed) 30 tablet 0   Blood Pressure Monitoring (BLOOD PRESSURE KIT) DEVI Use as directed once a week 1 each 0   Ipratropium-Albuterol (COMBIVENT) 20-100 MCG/ACT AERS respimat Inhale 1 puff into the lungs every 6 (six) hours as needed for wheezing or shortness of breath. 1 each 3   lidocaine (LIDODERM) 5 % Place 1 patch onto the skin daily. Remove & Discard patch within 12 hours 14 patch 0   methylPREDNISolone (MEDROL DOSEPAK) 4 MG TBPK tablet 6 Day Tapering Dose 21 tablet 0   polyethylene glycol powder (GLYCOLAX/MIRALAX) 17 GM/SCOOP powder Take 17 g by mouth daily as needed for mild constipation. 238 g 0   topiramate (TOPAMAX) 25 MG tablet Start with one pill(25mg ) at bedtime and in 1 week increase to 2 pills(50mg ) at bedtime 60 tablet 6   Galcanezumab-gnlm (EMGALITY) 120 MG/ML SOAJ Inject 240 mg into the skin every 30 (thirty) days. (Patient not taking: Reported on 05/10/2023)     Galcanezumab-gnlm (EMGALITY) 120 MG/ML SOSY Inject 120 mg into the skin every 30 (thirty) days. PLEASE USE COPAY CARD: BIN [id number] PCN PDMI GROUP [id number] ID [id number] expires: 04/18/2023, (Patient not taking: Reported on 05/10/2023) 1 mL 5   NIFEdipine (ADALAT CC) 30 MG 24 hr tablet Take 1 tablet (30 mg total) by mouth daily. 60 tablet 0   Rimegepant  Sulfate (NURTEC) 75 MG TBDP Take 1 tablet (75 mg total) by mouth daily as needed (take at onset of migraine. Do not exceed more than 1 tablet in 24 hours.). Take daily for 8 days. (Patient not taking: Reported on 05/10/2023) 8 tablet 0   No current facility-administered medications on file prior to visit.  Allergies  Allergen Reactions   Cocos Nucifera Swelling    Coconut   Ibuprofen Other (See Comments)    Upsets stomach   Mushroom Extract Complex (Do Not Select) Other (See Comments)    "her body mood just changes"     DIAGNOSTIC DATA (LABS, IMAGING, TESTING) - I reviewed patient records, labs, notes, testing and imaging myself where available.  Lab Results  Component Value Date   WBC 6.2 12/28/2022   HGB 12.0 12/28/2022   HCT 39.1 12/28/2022   MCV 85.4 12/28/2022   PLT 268 12/28/2022      Component Value Date/Time   NA 135 12/28/2022 1439   NA 140 04/21/2021 1638   K 3.6 12/28/2022 1439   CL 101 12/28/2022 1439   CO2 25 12/28/2022 1439   GLUCOSE 100 (H) 12/28/2022 1439   BUN 19 12/28/2022 1439   BUN 12 04/21/2021 1638   CREATININE 0.79 12/28/2022 1439   CALCIUM 9.1 12/28/2022 1439   PROT 7.6 12/28/2022 1439   PROT 6.4 04/21/2021 1638   ALBUMIN 4.0 12/28/2022 1439   ALBUMIN 4.1 04/21/2021 1638   AST 30 12/28/2022 1439   ALT 24 12/28/2022 1439   ALKPHOS 70 12/28/2022 1439   BILITOT 0.3 12/28/2022 1439   BILITOT <0.2 04/21/2021 1638   GFRNONAA >60 12/28/2022 1439   GFRAA >60 06/04/2018 0827   No results found for: "CHOL", "HDL", "LDLCALC", "LDLDIRECT", "TRIG", "CHOLHDL" Lab Results  Component Value Date   HGBA1C 5.8 (H) 03/31/2023   No results found for: "VITAMINB12" Lab Results  Component Value Date   TSH 1.240 03/31/2023    PHYSICAL EXAM:  Today's Vitals   05/10/23 1501  BP: 138/87  Pulse: 92  Weight: 210 lb (95.3 kg)  Height: 5\' 7"  (1.702 m)   Body mass index is 32.89 kg/m.   Wt Readings from Last 3 Encounters:  05/10/23 210 lb (95.3 kg)   04/21/23 212 lb (96.2 kg)  03/31/23 208 lb (94.3 kg)     Ht Readings from Last 3 Encounters:  05/10/23 5\' 7"  (1.702 m)  04/21/23 5\' 7"  (1.702 m)  03/31/23 5\' 7"  (1.702 m)      General: The patient is awake, alert and appears not in acute distress. The patient is well groomed. Head: Normocephalic, atraumatic. Neck is supple.  Mallampati : 2-3,  neck circumference:16.5  inches . Nasal airflow  patent.  Retrognathia is not  seen.  Dental status: biological  Cardiovascular:  Regular rate and cardiac rhythm by pulse,  without distended neck veins. Respiratory: Lungs are clear to auscultation.  Skin:  With evidence of ankle edema. Trunk: The patient's posture is erect.   NEUROLOGIC EXAM: The patient is awake and alert, oriented to place and time.   Memory subjective described as intact.  Attention span & concentration ability appears normal.  Speech is fluent,  without  dysarthria, dysphonia or aphasia.  Mood and affect are appropriate.   Cranial nerves: no loss of smell or taste reported  Pupils are equal and briskly reactive to light. Funduscopic exam deferred. .  Extraocular movements in vertical and horizontal planes were intact and without nystagmus. No Diplopia. Visual fields by finger perimetry are intact. Hearing was intact to soft voice and finger rubbing.    Facial sensation intact to fine touch.  Facial motor strength is symmetric and tongue and uvula move midline.  Neck ROM : rotation, tilt and flexion extension were normal for age and shoulder shrug was symmetrical.    Motor exam:  Symmetric bulk, tone and ROM.   Normal tone without cogwheeling, symmetric grip strength .   Sensory:  normal.   Coordination: Rapid alternating movements in the fingers/hands were of normal speed.   Gait and station: Patient could rise unassisted from a seated position, walked without assistive device.  Deep tendon reflexes: in the  upper and lower extremities are symmetric and intact.   Babinski response was deferred .    ASSESSMENT AND PLAN 28 y.o. year old female  here with:  Pt in room 2 girlfriend in room. Here for sleep consult. snores, wakes up with headache, wakes up with dry mouth, fatigue, daily headaches in morning and at bedtime. No history of sleep study. Takes topamax for headaches working well.     1) chronic migraine , improved on medications but still present 2 times a week .   2) no established routines for bedtime and meal time, activity time. Co-sleeping with toddler is making sleep difficult for her, fragmented sleep.  GF confirmed she is not a loud snorer.   3)  Epworth score closer to 13 , not 8/ 24 points.  I will offer a HST  but I don't see success without changing and establishing sleep hygiene.    I plan to follow up either personally or through our NP within 6-8  months.   I would like to thank [facility] and Anson Fret, Md [address] [place],  [place] [address] for allowing me to meet with and to take care of this pleasant patient.    After spending a total time of  35  minutes face to face and additional time for physical and neurologic examination, review of laboratory studies,  personal review of imaging studies, reports and results of other testing and review of referral information / records as far as provided in visit,   Electronically signed by: Melvyn Novas, MD 05/10/2023 3:36 PM  [facility] and [facility] certified by The [employer] of Sleep Medicine and Diplomate of the [employer] of Sleep Medicine. Board certified In Neurology through the ABPN, Fellow of the [employer] of Neurology.

## 2023-05-12 ENCOUNTER — Encounter (HOSPITAL_COMMUNITY): Payer: Self-pay | Admitting: Emergency Medicine

## 2023-05-12 ENCOUNTER — Ambulatory Visit (HOSPITAL_COMMUNITY)
Admission: EM | Admit: 2023-05-12 | Discharge: 2023-05-12 | Disposition: A | Discharge status: Home / Self Care | Payer: Medicaid Other | Attending: Emergency Medicine | Admitting: Emergency Medicine

## 2023-05-12 DIAGNOSIS — J069 Acute upper respiratory infection, unspecified: Secondary | ICD-10-CM | POA: Insufficient documentation

## 2023-05-12 MED ORDER — ONDANSETRON 4 MG PO TBDP
4.0000 mg | ORAL_TABLET | Freq: Once | ORAL | Status: AC
  Administered 2023-05-12: 4 mg via ORAL

## 2023-05-12 MED ORDER — IPRATROPIUM-ALBUTEROL 20-100 MCG/ACT IN AERS: 1.0000 | INHALATION_SPRAY | Freq: Four times a day (QID) | RESPIRATORY_TRACT | 3 refills | Status: AC | PRN

## 2023-05-12 MED ORDER — PROMETHAZINE-DM 6.25-15 MG/5ML PO SYRP: 5.0000 mL | ORAL_SOLUTION | Freq: Four times a day (QID) | ORAL | 0 refills | Status: AC | PRN

## 2023-05-12 MED ORDER — ONDANSETRON 4 MG PO TBDP
ORAL_TABLET | ORAL | Status: AC
  Filled 2023-05-12: qty 1

## 2023-05-12 MED ORDER — ONDANSETRON 4 MG PO TBDP: 4.0000 mg | ORAL_TABLET | Freq: Three times a day (TID) | ORAL | 0 refills | Status: AC | PRN

## 2023-05-12 MED ORDER — DEXAMETHASONE 6 MG PO TABS
6.0000 mg | ORAL_TABLET | Freq: Two times a day (BID) | ORAL | 0 refills | Status: AC
Start: 2023-05-12 — End: 2023-05-17

## 2023-05-12 NOTE — Discharge Instructions (Addendum)
You received a COVID-19 test today.  The result of your COVID-19 test will be posted to your MyChart once it is complete, typically this takes 6 to 12 hours.  We will contact you with your results once we receive them.     If your COVID-19 test is positive, you will be contacted by phone.  Unfortunately, due to the current duration of your symptoms, you will no longer benefit from antiviral therapy for COVID-19.     If your COVID-19 test is negative, please consider retesting in the next 2 to 3 days, particularly if you are not feeling any better.  You are welcome to return here to urgent care to have it done or you can take a home COVID-19 test.     If both your COVID-19 tests are negative, then you can safely assume that your illness is due to one of the many less serious illnesses circulating in our community right now.     Please read below to learn more about the medications, dosages and frequencies that I recommend to help alleviate your symptoms and to get you feeling better soon:   Decadron (dexamethasone): To address the inflammation caused by a viral illness, please begin Decadron 6 mg twice daily.  This should help with your headache, nausea, body aches and cough.   Combivent, DuoNeb (ipratropium and albuterol): This inhaled medication contains a short acting beta agonist bronchodilator and a muscarinic bronchodilator.  This medication works on the smooth muscle that opens and constricts of your airways by relaxing the muscle.  The result of relaxation of the smooth muscle is increased air movement and improved work of breathing.  This is a short acting medication that can be used every 4-6 hours as needed for increased work of breathing, shortness of breath, wheezing and excessive coughing.     Promethazine DM: Promethazine is both a nasal decongestant that dries up mucous membranes and an antinausea medication.  Promethazine often makes most patients feel fairly sleepy.  "DM" is  dextromethorphan, a single symptom reliever which is a cough suppressant found in many over-the-counter cough medications and combination cold preparations.  Please take 5 mL before bedtime to minimize your cough which will help you sleep better.  I have sent a prescription for this medication to your pharmacy because it cannot be purchased over-the-counter.  Zofran (ondansetron): This is a good antinausea medication that you can take every 8 hours as needed for symptoms of nausea and vomiting.  It is a dissolvable tablet that you do not need to take with water, just put it under your tongue and let it melt away.  I have sent a prescription to your pharmacy.   If symptoms have not meaningfully improved in the next 5 to 7 days, please return for repeat evaluation or follow-up with your regular provider.  If symptoms have worsened in the next 3 to 5 days, please return for repeat evaluation or follow-up with your regular provider.    Thank you for visiting urgent care today.  We appreciate the opportunity to participate in your care.

## 2023-05-12 NOTE — ED Provider Notes (Signed)
[facility]    CSN: [id number] Arrival date & time: 05/12/23  1502    HISTORY   Chief Complaint  Patient presents with   Headache   Emesis   Sore Throat   Nasal Congestion   Fever   HPI Mackenzie Schultz is a pleasant, 28 y.o. female who presents to urgent care today. Patient complains of nasal congestion, headache, sore throat and vomiting for the past 2 weeks.  Patient states she has tried over-the-counter medications but states they are not helping.  Patient has normal vital signs on arrival today, denies alcohol abuse, illicit drug abuse.  Patient states she has been exposed to COVID-19, approximately 2 weeks ago.  Patient also complains of loss of taste and smell.  Patient denies diarrhea.  The history is provided by the patient.   Past Medical History:  Diagnosis Date   Asthma    Heart murmur    Hypertension    Panic attacks    Patient Active Problem List   Diagnosis Date Noted   Sleep deprivation 05/10/2023   Excessive daytime sleepiness 05/10/2023   Inadequate sleep hygiene 05/10/2023   Chronic migraine without aura with status migrainosus, not intractable 05/10/2023   Acute blood loss as cause of postoperative anemia 07/11/2021   Cesarean delivery delivered 07/10/2021   Heart murmur 02/08/2021   Chronic hypertension in obstetric context in second trimester 02/08/2021   Maternal obesity affecting pregnancy, antepartum 01/11/2021   Supervision of high risk pregnancy, antepartum 12/23/2020   Past Surgical History:  Procedure Laterality Date   CESAREAN SECTION N/A 07/10/2021   Procedure: CESAREAN SECTION;  Surgeon: Milas Hock, MD;  Location: [facility];  Service: Obstetrics;  Laterality: N/A;   OB History     Gravida  1   Para  1   Term  1   Preterm  0   AB  0   Living  1      SAB  0   IAB  0   Ectopic  0   Multiple  0   Live Births  1          Home Medications    Prior to Admission medications   Medication Sig Start  Date End Date Taking? Authorizing Provider  Ipratropium-Albuterol (COMBIVENT) 20-100 MCG/ACT AERS respimat Inhale 1 puff into the lungs every 6 (six) hours as needed for wheezing or shortness of breath. 04/05/21  Yes Burleson, Terri L, NP  NIFEdipine (ADALAT CC) 30 MG 24 hr tablet Take 1 tablet (30 mg total) by mouth daily. 01/21/23 05/12/23 Yes Reva Bores, MD  polyethylene glycol powder (GLYCOLAX/MIRALAX) 17 GM/SCOOP powder Take 17 g by mouth daily as needed for mild constipation. 07/13/21  Yes Warner Mccreedy, MD  topiramate (TOPAMAX) 25 MG tablet Start with one pill(25mg ) at bedtime and in 1 week increase to 2 pills(50mg ) at bedtime 04/25/23  Yes Anson Fret, MD  acetaminophen (TYLENOL) 500 MG tablet Take 2 tablets (1,000 mg total) by mouth every 6 (six) hours. Patient taking differently: Take 1,000 mg by mouth every 6 (six) hours. As needed 07/13/21   Warner Mccreedy, MD  Blood Pressure Monitoring (BLOOD PRESSURE KIT) DEVI Use as directed once a week 01/20/23   Constant, Peggy, MD    Family History Family History  Problem Relation Age of Onset   Heart disease Mother    Hypertension Mother    Hypertension Father    Hypertension Other    Migraines Neg Hx    Social  History Social History   Tobacco Use   Smoking status: Former    Types: Cigars   Smokeless tobacco: Never  Vaping Use   Vaping status: Never Used  Substance Use Topics   Alcohol use: Not Currently    Comment: Occasional, not since confirmed pregnancy   Drug use: No   Allergies   Cocos nucifera, Ibuprofen, and Mushroom extract complex (do not select)  Review of Systems Review of Systems Pertinent findings revealed after performing a 14 point review of systems has been noted in the history of present illness.  Physical Exam Vital Signs BP 129/88 (BP Location: Left Arm)   Pulse 89   Temp 98.2 F (36.8 C) (Oral)   Resp 18   LMP 04/19/2023   SpO2 97%   Breastfeeding No   No data found.  Physical  Exam Constitutional:      General: She is awake. She is not in acute distress.    Appearance: Normal appearance. She is well-developed and well-groomed. She is ill-appearing.  Pulmonary:     Effort: Pulmonary effort is normal.     Breath sounds: No stridor, decreased air movement or transmitted upper airway sounds. Examination of the right-upper field reveals decreased breath sounds. Examination of the left-upper field reveals decreased breath sounds. Examination of the right-middle field reveals decreased breath sounds. Examination of the left-middle field reveals decreased breath sounds. Examination of the right-lower field reveals decreased breath sounds. Examination of the left-lower field reveals decreased breath sounds. Decreased breath sounds present. No wheezing, rhonchi or rales.  Neurological:     Mental Status: She is alert.  Psychiatric:        Behavior: Behavior is cooperative.     Visual Acuity Right Eye Distance:   Left Eye Distance:   Bilateral Distance:    Right Eye Near:   Left Eye Near:    Bilateral Near:     UC Couse / Diagnostics / Procedures:     Radiology No results found.  Procedures Procedures (including critical care time) EKG  Pending results:  Labs Reviewed  SARS CORONAVIRUS 2 (TAT 6-24 HRS)    Medications Ordered in UC: Medications  ondansetron (ZOFRAN-ODT) disintegrating tablet 4 mg (has no administration in time range)    UC Diagnoses / Final Clinical Impressions(s)   I have reviewed the triage vital signs and the nursing notes.  Pertinent labs & imaging results that were available during my care of the patient were reviewed by me and considered in my medical decision making (see chart for details).    Final diagnoses:  Viral URI with cough   COVID-19 testing performed given the patient has no sense of taste or smell.  Due to duration of symptoms, patient would no longer benefit from Paxlovid if positive.  Patient provided with  Decadron for relief of headache, sore throat, cough and nausea.  Patient provided with Zofran during her visit today so that she can begin Decadron without difficulty.    Please see discharge instructions below for details of plan of care as provided to patient. ED Prescriptions     Medication Sig Dispense Auth. Provider   dexamethasone (DECADRON) 6 MG tablet Take 1 tablet (6 mg total) by mouth 2 (two) times daily with a meal for 5 days. 10 tablet Theadora Rama Scales, PA-C   promethazine-dextromethorphan (PROMETHAZINE-DM) 6.25-15 MG/5ML syrup Take 5 mLs by mouth 4 (four) times daily as needed for cough. 118 mL Theadora Rama Scales, PA-C   ondansetron (ZOFRAN-ODT) 4 MG disintegrating  tablet Take 1 tablet (4 mg total) by mouth every 8 (eight) hours as needed for nausea or vomiting. 20 tablet Theadora Rama Scales, PA-C      PDMP not reviewed this encounter.  Pending results:  Labs Reviewed  SARS CORONAVIRUS 2 (TAT 6-24 HRS)      Discharge Instructions      You received a COVID-19 test today.  The result of your COVID-19 test will be posted to your MyChart once it is complete, typically this takes 6 to 12 hours.  We will contact you with your results once we receive them.     If your COVID-19 test is positive, you will be contacted by phone.  Unfortunately, due to the current duration of your symptoms, you will no longer benefit from antiviral therapy for COVID-19.     If your COVID-19 test is negative, please consider retesting in the next 2 to 3 days, particularly if you are not feeling any better.  You are welcome to return here to urgent care to have it done or you can take a home COVID-19 test.     If both your COVID-19 tests are negative, then you can safely assume that your illness is due to one of the many less serious illnesses circulating in our community right now.     Please read below to learn more about the medications, dosages and frequencies that I recommend to help  alleviate your symptoms and to get you feeling better soon:   Decadron (dexamethasone): To address the inflammation caused by a viral illness, please begin Decadron 6 mg twice daily.  This should help with your headache, nausea, body aches and cough.   Combivent, DuoNeb (ipratropium and albuterol): This inhaled medication contains a short acting beta agonist bronchodilator and a muscarinic bronchodilator.  This medication works on the smooth muscle that opens and constricts of your airways by relaxing the muscle.  The result of relaxation of the smooth muscle is increased air movement and improved work of breathing.  This is a short acting medication that can be used every 4-6 hours as needed for increased work of breathing, shortness of breath, wheezing and excessive coughing.     Promethazine DM: Promethazine is both a nasal decongestant that dries up mucous membranes and an antinausea medication.  Promethazine often makes most patients feel fairly sleepy.  "DM" is dextromethorphan, a single symptom reliever which is a cough suppressant found in many over-the-counter cough medications and combination cold preparations.  Please take 5 mL before bedtime to minimize your cough which will help you sleep better.  I have sent a prescription for this medication to your pharmacy because it cannot be purchased over-the-counter.  Zofran (ondansetron): This is a good antinausea medication that you can take every 8 hours as needed for symptoms of nausea and vomiting.  It is a dissolvable tablet that you do not need to take with water, just put it under your tongue and let it melt away.  I have sent a prescription to your pharmacy.   If symptoms have not meaningfully improved in the next 5 to 7 days, please return for repeat evaluation or follow-up with your regular provider.  If symptoms have worsened in the next 3 to 5 days, please return for repeat evaluation or follow-up with your regular provider.    Thank you  for visiting urgent care today.  We appreciate the opportunity to participate in your care.       Disposition Upon Discharge:  Condition: stable for discharge home  Patient presented with an acute illness with associated systemic symptoms and significant discomfort requiring urgent management. In my opinion, this is a condition that a prudent lay person (someone who possesses an average knowledge of health and medicine) may potentially expect to result in complications if not addressed urgently such as respiratory distress, impairment of bodily function or dysfunction of bodily organs.   Routine symptom specific, illness specific and/or disease specific instructions were discussed with the patient and/or caregiver at length.   As such, the patient has been evaluated and assessed, work-up was performed and treatment was provided in alignment with urgent care protocols and evidence based medicine.  Patient/parent/caregiver has been advised that the patient may require follow up for further testing and treatment if the symptoms continue in spite of treatment, as clinically indicated and appropriate.  Patient/parent/caregiver has been advised to return to the [facility] or PCP if no better; to PCP or the Emergency Department if new signs and symptoms develop, or if the current signs or symptoms continue to change or worsen for further workup, evaluation and treatment as clinically indicated and appropriate  The patient will follow up with their current PCP if and as advised. If the patient does not currently have a PCP we will assist them in obtaining one.   The patient may need specialty follow up if the symptoms continue, in spite of conservative treatment and management, for further workup, evaluation, consultation and treatment as clinically indicated and appropriate.  Patient/parent/caregiver verbalized understanding and agreement of plan as discussed.  All questions were addressed during visit.   Please see discharge instructions below for further details of plan.  This office note has been dictated using Teaching laboratory technician.  Unfortunately, this method of dictation can sometimes lead to typographical or grammatical errors.  I apologize for your inconvenience in advance if this occurs.  Please do not hesitate to reach out to me if clarification is needed.      Theadora Rama Scales, [place] 05/12/23 [phone]

## 2023-05-12 NOTE — ED Triage Notes (Signed)
Patient c/o nasal congestion, headache, sore throat and vomiting x 2 weeks. Patient states she has tried OTC cold medication but states it's not helping.

## 2023-05-13 LAB — SARS CORONAVIRUS 2 (TAT 6-24 HRS): SARS Coronavirus 2: NEGATIVE

## 2023-06-06 ENCOUNTER — Ambulatory Visit (HOSPITAL_BASED_OUTPATIENT_CLINIC_OR_DEPARTMENT_OTHER): Payer: Medicaid Other | Attending: Neurology | Admitting: Internal Medicine

## 2023-06-06 DIAGNOSIS — G43701 Chronic migraine without aura, not intractable, with status migrainosus: Secondary | ICD-10-CM | POA: Diagnosis not present

## 2023-06-06 DIAGNOSIS — G4719 Other hypersomnia: Secondary | ICD-10-CM | POA: Insufficient documentation

## 2023-06-06 DIAGNOSIS — Z72820 Sleep deprivation: Secondary | ICD-10-CM | POA: Insufficient documentation

## 2023-06-06 DIAGNOSIS — Z72821 Inadequate sleep hygiene: Secondary | ICD-10-CM | POA: Insufficient documentation

## 2023-06-08 ENCOUNTER — Other Ambulatory Visit (HOSPITAL_COMMUNITY): Payer: Self-pay

## 2023-06-25 NOTE — Procedures (Signed)
     [facility] [address] [place], [place] [address] Tel: [phone]   Fax: [phone]  Home Sleep Test Interpretation  Patient Name:  Mackenzie Schultz, Mackenzie Schultz Date:  06/06/2023 Referring Physician:  Dayton Scrape, NP  Indications for Polysomnography The patient is a 28 year old Female who is 5\' 7"  and weighs 210.0 lbs. Her BMI equals 33.0.  A home sleep apnea test was performed to evaluate for OSA  Polysomnogram Data A home sleep test recorded the standard physiologic parameters including EKG, nasal and oral airflow.  Respiratory parameters of chest and abdominal movements were recorded with Respiratory Inductance Plethysmography belts.  Oxygen saturation was recorded by pulse oximetry.   Study Architecture The total recording time of the polysomnogram was 366.1 minutes.  The total monitoring time was 367.0 minutes.  Time spent in Supine position was - minutes.   Respiratory Events The study revealed a presence of 28 obstructive, 1 central, and - mixed apneas resulting in an Apnea index of 4.7 events per hour. There were 26 hypopneas (>=3% desaturation and/or arousal) resulting in an Apnea\Hypopnea Index (AHI >=3% desaturation and/or arousal) of 9.0 events per hour. There were 24 hypopneas (>=4% desaturation) resulting in an Apnea\Hypopnea Index (AHI >=4% desaturation) of 8.7 events per hour.  There were - Respiratory Effort Related Arousals resulting in a RERA index of - events per hour. The Respiratory Disturbance Index is 9.0 events per hour.  The snore index was - events per hour.  Mean oxygen saturation was 97.5%.  The lowest oxygen saturation during monitoring time was 69.0%. Time spent <=88% oxygen saturation was 1.5 minutes (0.4%).  Cardiac Summary The average pulse rate was 79.2 bpm.  The minimum pulse rate was 67.0 bpm while the maximum pulse rate was 179.0 bpm. Cardiac rhythm was normal/abnormal.  Comment: Mild obstructive sleep  apnea, AHI (4%) 8.7/hr. Snoring with oxygen desaturation to a nadir of 69% and mean 97.5%.  Diagnosis: Obstructive sleep apnea  Recommendations: Treatment for mild obstructive sleep apnea is guided by symptoms and co-morbidity. Conservative measures may include observation, weight loss and sleep position off back. Other options, including CPAP, a fitted oral appliance, or ENT evaluation, would be based on clinical judgment.   This study was personally reviewed and electronically signed by: Jetty Duhamel, MD Accredited Board Certified in Sleep Medicine Date/Time: 06/18/23   10:00AM                           Jetty Duhamel Diplomate, American Board of Sleep Medicine  ELECTRONICALLY SIGNED ON:  06/25/2023, 12:48 PM [facility] PH: [phone]   FX: [phone] ACCREDITED BY THE AMERICAN ACADEMY OF SLEEP MEDICINE

## 2023-07-13 ENCOUNTER — Other Ambulatory Visit: Payer: Self-pay | Admitting: Family Medicine

## 2023-07-13 ENCOUNTER — Other Ambulatory Visit (HOSPITAL_COMMUNITY): Payer: Self-pay

## 2023-07-19 ENCOUNTER — Telehealth (INDEPENDENT_AMBULATORY_CARE_PROVIDER_SITE_OTHER): Payer: BLUE CROSS/BLUE SHIELD | Admitting: Neurology

## 2023-07-19 DIAGNOSIS — G43701 Chronic migraine without aura, not intractable, with status migrainosus: Secondary | ICD-10-CM

## 2023-07-19 NOTE — Progress Notes (Signed)
 [facility]    Provider:  Dr Lucia Gaskins Requesting Provider: [facility] * Primary Care Provider:  [facility]  Virtual Visit via Video Note  I connected with Mackenzie Schultz on 07/23/23 at  1:00 PM EDT by a video enabled telemedicine application and verified that I am speaking with the correct person using two identifiers.  Location: Patient: home Provider: office   I discussed the limitations of evaluation and management by telemedicine and the availability of in person appointments. The patient expressed understanding and agreed to proceed.   Follow Up Instructions:    I discussed the assessment and treatment plan with the patient. The patient was provided an opportunity to ask questions and all were answered. The patient agreed with the plan and demonstrated an understanding of the instructions.   The patient was advised to call back or seek an in-person evaluation if the symptoms worsen or if the condition fails to improve as anticipated.  I provided 10 minutes of non-face-to-face time during this encounter.   Mackenzie Fret, MD  CC:  headache, fatigue  07/23/2023: Topiramate is helping. Headaches are doing well. Also the acute medication is doing well. Continue. No other focal neurologic deficits, associated symptoms, inciting events or modifiable factors.  Patient complains of symptoms per HPI as well as the following symptoms: none . Pertinent negatives and positives per HPI. All others negative   HPI 03/31/2023:  Mackenzie Schultz is a 28 y.o. female here as requested by [facility] * for headaches. has Supervision of high risk pregnancy, antepartum; Maternal obesity affecting pregnancy, antepartum; Heart murmur; Chronic hypertension in obstetric context in second trimester; Cesarean delivery delivered; Acute blood loss as cause of postoperative anemia; Sleep deprivation; Excessive daytime sleepiness;  Inadequate sleep hygiene; and Chronic migraine without aura with status migrainosus, not intractable on their problem list.  Has had headaches all her life. Getting worse, cries herself to sleep, pulsating/pounding/throbbing, in the left side behind the eye. Phonophobia/photophobia, nausea, smells bother her, sleep helps, a dark room helps, she does not vomit, she snores, wakes up with headache, wakes up with dry mouth, excesively tired, large neck, daily headaches. Affecting quality of life. At least 15 migraine days a month that can last 8 to 24 hours to be moderate to severe, daily headaches, ongoing for greater than a year. Can't care for child.   Reviewed notes, labs and imaging from outside physicians, which showed:  CLINICAL DATA:  Recurrent headaches, worsening   EXAM: CT HEAD WITHOUT CONTRAST   TECHNIQUE: Contiguous axial images were obtained from the base of the skull through the vertex without intravenous contrast.   RADIATION DOSE REDUCTION: This exam was performed according to the departmental dose-optimization program which includes automated exposure control, adjustment of the mA and/or kV according to patient size and/or use of iterative reconstruction technique.   COMPARISON:  01/23/2020   FINDINGS: Brain: No evidence of acute infarction, hemorrhage, mass, mass effect, or midline shift. No hydrocephalus or extra-axial fluid collection.   Vascular: No hyperdense vessel.   Skull: Negative for fracture or focal lesion.   Sinuses/Orbits: Clear paranasal sinuses.  Normal orbits.   Other: The mastoid air cells are well aerated.   IMPRESSION: No acute intracranial process. No etiology is seen for the patient's headaches.  Review of Systems: Patient complains of symptoms per HPI as well as the following symptoms fatigue. Pertinent negatives and positives per HPI. All others negative.   Social History  Socioeconomic History   Marital status: Single    Spouse  name: Not on file   Number of children: 1   Years of education: Not on file   Highest education level: Not on file  Occupational History   Occupation: Unemployed  Tobacco Use   Smoking status: Former    Types: Cigars   Smokeless tobacco: Never  Vaping Use   Vaping status: Never Used  Substance and Sexual Activity   Alcohol use: Not Currently    Comment: Occasional, not since confirmed pregnancy   Drug use: No   Sexual activity: Yes    Partners: Male    Birth control/protection: None  Other Topics Concern   Not on file  Social History Narrative   Caffeine: none   Left  handed   Social Drivers of Health   Financial Resource Strain: Not on file  Food Insecurity: Not on file  Transportation Needs: Not on file  Physical Activity: Not on file  Stress: Not on file  Social Connections: Not on file  Intimate Partner Violence: Not on file    Family History  Problem Relation Age of Onset   Heart disease Mother    Hypertension Mother    Hypertension Father    Hypertension Other    Migraines Neg Hx     Past Medical History:  Diagnosis Date   Asthma    Heart murmur    Hypertension    Panic attacks    Seizure [facility])     Patient Active Problem List   Diagnosis Date Noted   Sleep deprivation 05/10/2023   Excessive daytime sleepiness 05/10/2023   Inadequate sleep hygiene 05/10/2023   Chronic migraine without aura with status migrainosus, not intractable 05/10/2023   Acute blood loss as cause of postoperative anemia 07/11/2021   Cesarean delivery delivered 07/10/2021   Heart murmur 02/08/2021   Chronic hypertension in obstetric context in second trimester 02/08/2021   Maternal obesity affecting pregnancy, antepartum 01/11/2021   Supervision of high risk pregnancy, antepartum 12/23/2020    Past Surgical History:  Procedure Laterality Date   CESAREAN SECTION N/A 07/10/2021   Procedure: CESAREAN SECTION;  Surgeon: Milas Hock, MD;  Location: [facility] LD ORS;  Service:  Obstetrics;  Laterality: N/A;    Current Outpatient Medications  Medication Sig Dispense Refill   acetaminophen (TYLENOL) 500 MG tablet Take 2 tablets (1,000 mg total) by mouth every 6 (six) hours. (Patient taking differently: Take 1,000 mg by mouth every 6 (six) hours. As needed) 30 tablet 0   Blood Pressure Monitoring (BLOOD PRESSURE KIT) DEVI Use as directed once a week 1 each 0   Ipratropium-Albuterol (COMBIVENT) 20-100 MCG/ACT AERS respimat Inhale 1 puff into the lungs every 6 (six) hours as needed for wheezing or shortness of breath. 1 each 3   NIFEdipine (ADALAT CC) 30 MG 24 hr tablet Take 1 tablet (30 mg total) by mouth daily. 60 tablet 0   ondansetron (ZOFRAN-ODT) 4 MG disintegrating tablet Take 1 tablet (4 mg total) by mouth every 8 (eight) hours as needed for nausea or vomiting. 20 tablet 0   polyethylene glycol powder (GLYCOLAX/MIRALAX) 17 GM/SCOOP powder Take 17 g by mouth daily as needed for mild constipation. 238 g 0   promethazine-dextromethorphan (PROMETHAZINE-DM) 6.25-15 MG/5ML syrup Take 5 mLs by mouth 4 (four) times daily as needed for cough. 118 mL 0   topiramate (TOPAMAX) 50 MG tablet Take 1 tablet (50 mg total) by mouth daily. 90 tablet 4   No current facility-administered medications  for this visit.    Allergies as of 07/19/2023 - Review Complete 05/12/2023  Allergen Reaction Noted   Cocos nucifera Swelling 07/07/2021   Ibuprofen Other (See Comments) 06/04/2018   Mushroom extract complex (obsolete) Other (See Comments) 06/04/2018    Vitals: There were no vitals taken for this visit. Last Weight:  Wt Readings from Last 1 Encounters:  07/22/23 212 lb (96.2 kg)   Last Height:   Ht Readings from Last 1 Encounters:  07/22/23 5\' 7"  (1.702 m)    Physical exam: Exam: Gen: NAD, conversant      CV: No palpitations or chest pain or SOB. VS: Breathing at a normal rate. Weight appears obese. Not febrile. Eyes: Conjunctivae clear without exudates or  hemorrhage  Neuro: Detailed Neurologic Exam  Speech:    Speech is normal; fluent and spontaneous with normal comprehension.  Cognition:    The patient is oriented to person, place, and time;     recent and remote memory intact;     language fluent;     normal attention, concentration, fund of knowledge Cranial Nerves:    The pupils are equal, round, and reactive to light. Visual fields are full Extraocular movements are intact.  The face is symmetric with normal sensation. The palate elevates in the midline. Hearing intact. Voice is normal. Shoulder shrug is normal. The tongue has normal motion without fasciculations.   Coordination: normal  Gait:    No abnormalities noted or reported  Motor Observation:   no involuntary movements noted. Tone:    Appears normal  Posture:    Posture is normal. normal erect    Strength:    Strength is anti-gravity and symmetric in the upper and lower limbs.      Sensation: intact to LT, no reports of numbness or tingling or paresthesias       Assessment/Plan: Patient with chronic migraines for greater than 1 year.  We discussed all options, extended visit, here with girlfriend who provides much information as well, we discussed migraines, migraine management, acute and preventative, educated her on migraine lifestyle and health and risk factors.  Signed her up for emgality card: Insurance would not approved  Sleep doctor referral : she snores, wakes up with headache, wakes up with dry mouth, excesively tired, large neck, daily headaches. ESS . 17, obese, referred saw Dr. Vickey Huger 05/10/2023 doesn;t appear she completed sleep study  Encouraged finding a new pcp  Continue Topiramate daily  Can follow up of ask pcp to refill topiramate  Meds ordered this encounter  Medications   topiramate (TOPAMAX) 50 MG tablet    Sig: Take 1 tablet (50 mg total) by mouth daily.    Dispense:  90 tablet    Refill:  4      To prevent or relieve  headaches, try the following: Cool Compress. Lie down and place a cool compress on your head.  Avoid headache triggers. If certain foods or odors seem to have triggered your migraines in the past, avoid them. A headache diary might help you identify triggers.  Include physical activity in your daily routine. Try a daily walk or other moderate aerobic exercise.  Manage stress. Find healthy ways to cope with the stressors, such as delegating tasks on your to-do list.  Practice relaxation techniques. Try deep breathing, yoga, massage and visualization.  Eat regularly. Eating regularly scheduled meals and maintaining a healthy diet might help prevent headaches. Also, drink plenty of fluids.  Follow a regular sleep schedule. Sleep deprivation might  contribute to headaches Consider biofeedback. With this mind-body technique, you learn to control certain bodily functions -- such as muscle tension, heart rate and blood pressure -- to prevent headaches or reduce headache pain.    Proceed to emergency room if you experience new or worsening symptoms or symptoms do not resolve, if you have new neurologic symptoms or if headache is severe, or for any concerning symptom.   Provided education and documentation from American headache Society toolbox including articles on: chronic migraine medication overuse headache, chronic migraines, prevention of migraines, behavioral and other nonpharmacologic treatments for headache.    Cc: [facility]  [facility]  Naomie Dean, MD  [facility] [address] [place], [place] [address]  Phone [phone] Fax [phone]

## 2023-07-22 ENCOUNTER — Emergency Department (HOSPITAL_COMMUNITY)

## 2023-07-22 ENCOUNTER — Encounter (HOSPITAL_COMMUNITY): Payer: Self-pay

## 2023-07-22 ENCOUNTER — Other Ambulatory Visit: Payer: Self-pay

## 2023-07-22 ENCOUNTER — Emergency Department (HOSPITAL_COMMUNITY)
Admission: EM | Admit: 2023-07-22 | Discharge: 2023-07-22 | Disposition: A | Discharge status: Home / Self Care | Attending: Emergency Medicine | Admitting: Emergency Medicine

## 2023-07-22 DIAGNOSIS — R569 Unspecified convulsions: Secondary | ICD-10-CM

## 2023-07-22 DIAGNOSIS — I10 Essential (primary) hypertension: Secondary | ICD-10-CM | POA: Insufficient documentation

## 2023-07-22 DIAGNOSIS — Z79899 Other long term (current) drug therapy: Secondary | ICD-10-CM | POA: Diagnosis not present

## 2023-07-22 HISTORY — DX: Unspecified convulsions: R56.9

## 2023-07-22 LAB — RAPID URINE DRUG SCREEN, HOSP PERFORMED
Amphetamines: NOT DETECTED
Barbiturates: NOT DETECTED
Benzodiazepines: NOT DETECTED
Cocaine: NOT DETECTED
Opiates: NOT DETECTED
Tetrahydrocannabinol: NOT DETECTED

## 2023-07-22 LAB — URINALYSIS, ROUTINE W REFLEX MICROSCOPIC
Bilirubin Urine: NEGATIVE
Glucose, UA: NEGATIVE mg/dL
Ketones, ur: NEGATIVE mg/dL
Leukocytes,Ua: NEGATIVE
Nitrite: NEGATIVE
Protein, ur: NEGATIVE mg/dL
Specific Gravity, Urine: 1.015 (ref 1.005–1.030)
pH: 6 (ref 5.0–8.0)

## 2023-07-22 LAB — BASIC METABOLIC PANEL WITH GFR
Anion gap: 7 (ref 5–15)
BUN: 20 mg/dL (ref 6–20)
CO2: 20 mmol/L — ABNORMAL LOW (ref 22–32)
Calcium: 8.7 mg/dL — ABNORMAL LOW (ref 8.9–10.3)
Chloride: 108 mmol/L (ref 98–111)
Creatinine, Ser: 0.92 mg/dL (ref 0.44–1.00)
GFR, Estimated: >60 mL/min (ref >60)
Glucose, Bld: 99 mg/dL (ref 70–99)
Potassium: 3.7 mmol/L (ref 3.5–5.1)
Sodium: 135 mmol/L (ref 135–145)

## 2023-07-22 LAB — CBG MONITORING, ED: Glucose-Capillary: 98 mg/dL (ref 70–99)

## 2023-07-22 LAB — CK: Total CK: 978 U/L — ABNORMAL HIGH (ref 38–234)

## 2023-07-22 LAB — CBC
HCT: 35.4 % — ABNORMAL LOW (ref 36.0–46.0)
Hemoglobin: 11.1 g/dL — ABNORMAL LOW (ref 12.0–15.0)
MCH: 28.6 pg (ref 26.0–34.0)
MCHC: 31.4 g/dL (ref 30.0–36.0)
MCV: 91.2 fL (ref 80.0–100.0)
Platelets: 234 10*3/uL (ref 150–400)
RBC: 3.88 MIL/uL (ref 3.87–5.11)
RDW: 13.2 % (ref 11.5–15.5)
WBC: 7.4 10*3/uL (ref 4.0–10.5)
nRBC: 0 % (ref 0.0–0.2)

## 2023-07-22 LAB — HEPATIC FUNCTION PANEL
ALT: 31 U/L (ref 0–44)
AST: 36 U/L (ref 15–41)
Albumin: 4.2 g/dL (ref 3.5–5.0)
Alkaline Phosphatase: 65 U/L (ref 38–126)
Bilirubin, Direct: <0.1 mg/dL (ref 0.0–0.2)
Total Bilirubin: 0.6 mg/dL (ref 0.0–1.2)
Total Protein: 8 g/dL (ref 6.5–8.1)

## 2023-07-22 LAB — HCG, SERUM, QUALITATIVE: Preg, Serum: NEGATIVE

## 2023-07-22 MED ORDER — SODIUM CHLORIDE 0.9 % IV BOLUS
1000.0000 mL | Freq: Once | INTRAVENOUS | Status: AC
  Administered 2023-07-22: 1000 mL via INTRAVENOUS

## 2023-07-22 NOTE — ED Triage Notes (Addendum)
 Patient BIB [facility] from home. Patient began having a seizure lasting around 45 seconds. Was sitting in the bed felt a sensation and then had a seizure. EMS inserted NPA. Patient woke up and then became alert. Patient does not take any medication for her seizures. Complaining of head pain.   EMS 18G left AC

## 2023-07-22 NOTE — ED Provider Notes (Signed)
 [place] EMERGENCY DEPARTMENT AT [facility] Provider Note   CSN: [id number] Arrival date & time: 07/22/23  1334     History  Chief Complaint  Patient presents with   Seizures    Mackenzie Schultz is a 28 y.o. female, History of panic attacks, hypertension, migraines, who presents to the ED for possible seizure.  She states that she was inside, while her girlfriend was outside, with the child, when she started feeling very ill, and lightheaded.  She states she started feeling like her heart was racing, and a little bit lightheaded, like her blood pressure was up.  She called her girlfriend into the house, told her that she needs to lay down.  Girlfriend laid her down, and patient told girlfriend, to call EMS.  When EMS came, per girlfriend, patient was seizing, with one of her arms twitching, and both of her legs twitching.  Her eyes were rolling back in the back of her head, and lasted for about 10 to 15 minutes.  She seemed confused afterwards.  Notes that she has never had a seizure before, that is why she is concerned.  She denies any kind of upper respiratory symptoms, urinary symptoms, abdominal pain, fevers or chills.  Has stated that she has been under more stress recently.  Reports that she has chronic headaches, which she sees neurology for.    Home Medications Prior to Admission medications   Medication Sig Start Date End Date Taking? Authorizing Provider  acetaminophen (TYLENOL) 500 MG tablet Take 2 tablets (1,000 mg total) by mouth every 6 (six) hours. Patient taking differently: Take 1,000 mg by mouth every 6 (six) hours. As needed 07/13/21   Warner Mccreedy, MD  Blood Pressure Monitoring (BLOOD PRESSURE KIT) DEVI Use as directed once a week 01/20/23   Constant, Peggy, MD  Ipratropium-Albuterol (COMBIVENT) 20-100 MCG/ACT AERS respimat Inhale 1 puff into the lungs every 6 (six) hours as needed for wheezing or shortness of breath. 05/12/23   Theadora Rama Scales, PA-C   NIFEdipine (ADALAT CC) 30 MG 24 hr tablet Take 1 tablet (30 mg total) by mouth daily. 01/21/23 03/25/23  Reva Bores, MD  ondansetron (ZOFRAN-ODT) 4 MG disintegrating tablet Take 1 tablet (4 mg total) by mouth every 8 (eight) hours as needed for nausea or vomiting. 05/12/23   Theadora Rama Scales, PA-C  polyethylene glycol powder (GLYCOLAX/MIRALAX) 17 GM/SCOOP powder Take 17 g by mouth daily as needed for mild constipation. 07/13/21   Warner Mccreedy, MD  promethazine-dextromethorphan (PROMETHAZINE-DM) 6.25-15 MG/5ML syrup Take 5 mLs by mouth 4 (four) times daily as needed for cough. 05/12/23   Theadora Rama Scales, PA-C  topiramate (TOPAMAX) 25 MG tablet Start with one pill(25mg ) at bedtime and in 1 week increase to 2 pills(50mg ) at bedtime 04/25/23   Anson Fret, MD      Allergies    Cocos nucifera, Ibuprofen, and Mushroom extract complex (obsolete)    Review of Systems   Review of Systems  Neurological:  Positive for seizures.    Physical Exam Updated Vital Signs BP (!) 115/98   Pulse 73   Temp 98.5 F (36.9 C) (Oral)   Resp 16   Ht 5\' 7"  (1.702 m)   Wt 96.2 kg   SpO2 100%   BMI 33.20 kg/m  Physical Exam Vitals and nursing note reviewed.  Constitutional:      General: She is not in acute distress.    Appearance: She is well-developed.  HENT:  Head: Normocephalic and atraumatic.  Eyes:     Conjunctiva/sclera: Conjunctivae normal.  Cardiovascular:     Rate and Rhythm: Normal rate and regular rhythm.     Heart sounds: No murmur heard. Pulmonary:     Effort: Pulmonary effort is normal. No respiratory distress.     Breath sounds: Normal breath sounds.  Abdominal:     Palpations: Abdomen is soft.     Tenderness: There is no abdominal tenderness.  Musculoskeletal:        General: No swelling.     Cervical back: Neck supple.  Skin:    General: Skin is warm and dry.     Capillary Refill: Capillary refill takes less than 2 seconds.  Neurological:     Mental Status:  She is alert.  Psychiatric:        Mood and Affect: Mood normal.     ED Results / Procedures / Treatments   Labs (all labs ordered are listed, but only abnormal results are displayed) Labs Reviewed  BASIC METABOLIC PANEL WITH GFR - Abnormal; Notable for the following components:      Result Value   CO2 20 (*)    Calcium 8.7 (*)    All other components within normal limits  CBC - Abnormal; Notable for the following components:   Hemoglobin 11.1 (*)    HCT 35.4 (*)    All other components within normal limits  URINALYSIS, ROUTINE W REFLEX MICROSCOPIC - Abnormal; Notable for the following components:   APPearance HAZY (*)    Hgb urine dipstick MODERATE (*)    Bacteria, UA RARE (*)    All other components within normal limits  HCG, SERUM, QUALITATIVE  HEPATIC FUNCTION PANEL  RAPID URINE DRUG SCREEN, HOSP PERFORMED  CK  CBG MONITORING, ED  CBG MONITORING, ED    EKG None  Radiology DG Chest 2 View Result Date: 07/22/2023 CLINICAL DATA:  Patient began having a seizure lasting around 45 seconds today. C/o central chest pain. EXAM: CHEST - 2 VIEW COMPARISON:  Chest x-ray 01/24/2017. FINDINGS: The heart and mediastinal contours are unchanged. No focal consolidation. No pulmonary edema. No pleural effusion. No pneumothorax. No acute osseous abnormality. IMPRESSION: No active cardiopulmonary disease. Electronically Signed   By: Tish Frederickson M.D.   On: 07/22/2023 14:48   CT Head Wo Contrast Result Date: 07/22/2023 CLINICAL DATA:  Seizure, new-onset, no history of trauma EXAM: CT HEAD WITHOUT CONTRAST TECHNIQUE: Contiguous axial images were obtained from the base of the skull through the vertex without intravenous contrast. RADIATION DOSE REDUCTION: This exam was performed according to the departmental dose-optimization program which includes automated exposure control, adjustment of the mA and/or kV according to patient size and/or use of iterative reconstruction technique. COMPARISON:   CT head 12/28/2022 FINDINGS: Brain: No evidence of large-territorial acute infarction. No parenchymal hemorrhage. No mass lesion. No extra-axial collection. No mass effect or midline shift. No hydrocephalus. Basilar cisterns are patent. Vascular: No hyperdense vessel. Skull: No acute fracture or focal lesion. Sinuses/Orbits: Paranasal sinuses and mastoid air cells are clear. The orbits are unremarkable. Other: None. IMPRESSION: No acute intracranial abnormality. Electronically Signed   By: Tish Frederickson M.D.   On: 07/22/2023 14:47    Procedures Procedures    Medications Ordered in ED Medications  sodium chloride 0.9 % bolus 1,000 mL (1,000 mLs Intravenous New Bag/Given 07/22/23 1425)    ED Course/ Medical Decision Making/ A&P Clinical Course as of 07/22/23 1603  Sat Jul 22, 2023  1555 SZ like  episode. [CC]    Clinical Course User Index [CC] Glyn Ade, MD                                 Medical Decision Making Patient is a 28 year old female, here for a seizure-like episode, or EMS tried to put an NPA, down her, and she immediately woke up.  She states she was little bit confused afterwards, reports history of chronic migraines.  Head CT ordered, given new seizure, as well as blood work, ordered, to rule out any kind of infectious etiology.  She is overall well-appearing has no focal deficits.  She states that she has no history of seizures, but upon further out screen, she states that she sometimes has seizures when she is stressed out.  Amount and/or Complexity of Data Reviewed Labs: ordered.    Details: Unremarkable Radiology: ordered.    Details: CT head unremarkable, chest x-ray clear Discussion of management or test interpretation with external provider(s): Discussed with patient, and girlfriend at bedside, patient's seizures sound more nonepileptic, however still given seizure precautions, and encouraged to follow-up with neurology.  She is already established with  neurology, I encouraged her to follow-up with her neurologist.  She has no neurodeficits on my exam, given the extensive length of the seizure, and that she awaken, after the NPA, was tried to be placed, this is suspicious for pseudoseizure.  Girlfriend at bedside states this happens more when she stressed out.  Discharge, with return precautions and follow-up with her neurologist    Final Clinical Impression(s) / ED Diagnoses Final diagnoses:  Seizure-like activity [facility])    Rx / DC Orders ED Discharge Orders     None         Kimon Loewen, Harley [place], PA 07/22/23 1604    Glyn Ade, MD 07/22/23 2308

## 2023-07-22 NOTE — Discharge Instructions (Signed)
 It is unclear, whether your seizure was epileptic versus nonepileptic.  Please do not drive, or bathe alone, until cleared by neurology.  Please follow-up with neurology, or your neurologist, that you already established with.  And discussed the possibility of possible seizure.  Return to the ER if you feel like your symptoms are worsening.

## 2023-07-23 ENCOUNTER — Encounter: Payer: Self-pay | Admitting: Neurology

## 2023-07-23 MED ORDER — TOPIRAMATE 50 MG PO TABS
50.0000 mg | ORAL_TABLET | Freq: Every day | ORAL | 4 refills | Status: AC
Start: 2023-07-23 — End: ?

## 2023-07-24 NOTE — Telephone Encounter (Signed)
 I called [facility] and told them there was a prescription sent for Topiramate 50 mg but the 25 mg was to be canceled.  The patient filled 25 mg yesterday.  The pharmacy said the 50 mg bottle is ready for pickup and they cannot recline or take back the 25 mg but the patient can double up her 25 mg tablets until she runs out and switches to 50 mg. I talked to the patient. She will do this and I reminded her to make sure when she picks up the 50 mg to only take 1 per day instead of 2.

## 2023-08-10 ENCOUNTER — Other Ambulatory Visit (HOSPITAL_COMMUNITY): Payer: Self-pay | Admitting: Family Medicine

## 2023-08-10 ENCOUNTER — Other Ambulatory Visit: Payer: Self-pay | Admitting: Family Medicine

## 2023-08-10 ENCOUNTER — Other Ambulatory Visit (HOSPITAL_COMMUNITY): Payer: Self-pay

## 2023-08-11 ENCOUNTER — Other Ambulatory Visit (HOSPITAL_COMMUNITY): Payer: Self-pay

## 2023-08-14 ENCOUNTER — Other Ambulatory Visit (HOSPITAL_COMMUNITY): Payer: Self-pay

## 2023-09-11 ENCOUNTER — Other Ambulatory Visit: Payer: Self-pay | Admitting: Family Medicine

## 2023-09-13 ENCOUNTER — Other Ambulatory Visit: Payer: Self-pay | Admitting: Obstetrics and Gynecology

## 2023-09-13 DIAGNOSIS — Z3481 Encounter for supervision of other normal pregnancy, first trimester: Secondary | ICD-10-CM

## 2023-10-06 ENCOUNTER — Other Ambulatory Visit (HOSPITAL_COMMUNITY): Payer: Self-pay

## 2023-10-06 ENCOUNTER — Other Ambulatory Visit: Payer: Self-pay

## 2023-10-06 MED ORDER — NIFEDIPINE ER 30 MG PO TB24
30.0000 mg | ORAL_TABLET | Freq: Every day | ORAL | 0 refills | Status: AC
  Filled 2023-10-06: qty 60, 60d supply, fill #0

## 2023-10-11 ENCOUNTER — Other Ambulatory Visit: Payer: Self-pay

## 2023-10-12 ENCOUNTER — Other Ambulatory Visit (HOSPITAL_COMMUNITY): Payer: Self-pay

## 2023-12-01 ENCOUNTER — Other Ambulatory Visit (HOSPITAL_COMMUNITY): Payer: Self-pay
